# Patient Record
Sex: Female | Born: 1950 | Race: White | Hispanic: No | Marital: Married | Smoking: Former smoker
Health system: Southern US, Community
[De-identification: ages and names within clinical notes are randomized; demographics above are authoritative.]

## PROBLEM LIST (undated history)

## (undated) DIAGNOSIS — C801 Malignant (primary) neoplasm, unspecified: Secondary | ICD-10-CM

## (undated) DIAGNOSIS — M1990 Unspecified osteoarthritis, unspecified site: Secondary | ICD-10-CM

## (undated) DIAGNOSIS — K759 Inflammatory liver disease, unspecified: Secondary | ICD-10-CM

## (undated) DIAGNOSIS — E119 Type 2 diabetes mellitus without complications: Secondary | ICD-10-CM

## (undated) DIAGNOSIS — I10 Essential (primary) hypertension: Secondary | ICD-10-CM

## (undated) DIAGNOSIS — M25519 Pain in unspecified shoulder: Secondary | ICD-10-CM

## (undated) DIAGNOSIS — F419 Anxiety disorder, unspecified: Secondary | ICD-10-CM

## (undated) DIAGNOSIS — G8929 Other chronic pain: Secondary | ICD-10-CM

## (undated) DIAGNOSIS — F329 Major depressive disorder, single episode, unspecified: Secondary | ICD-10-CM

## (undated) DIAGNOSIS — K7460 Unspecified cirrhosis of liver: Secondary | ICD-10-CM

## (undated) DIAGNOSIS — F32A Depression, unspecified: Secondary | ICD-10-CM

## (undated) HISTORY — DX: Inflammatory liver disease, unspecified: K75.9

## (undated) HISTORY — DX: Depression, unspecified: F32.A

## (undated) HISTORY — DX: Other chronic pain: G89.29

## (undated) HISTORY — DX: Unspecified cirrhosis of liver: K74.60

## (undated) HISTORY — DX: Pain in unspecified shoulder: M25.519

## (undated) HISTORY — DX: Major depressive disorder, single episode, unspecified: F32.9

## (undated) HISTORY — DX: Anxiety disorder, unspecified: F41.9

## (undated) HISTORY — PX: CHOLECYSTECTOMY: SHX55

## (undated) HISTORY — PX: APPENDECTOMY: SHX54

## (undated) HISTORY — PX: EYE SURGERY: SHX253

## (undated) HISTORY — PX: OTHER SURGICAL HISTORY: SHX169

---

## 2005-08-02 LAB — HM COLONOSCOPY

## 2005-08-15 ENCOUNTER — Encounter
Admission: RE | Admit: 2005-08-15 | Discharge: 2005-11-13 | Payer: Self-pay | Admitting: Physical Medicine & Rehabilitation

## 2005-08-15 ENCOUNTER — Ambulatory Visit: Payer: Self-pay | Admitting: Physical Medicine & Rehabilitation

## 2005-09-09 ENCOUNTER — Ambulatory Visit: Payer: Self-pay | Admitting: Physical Medicine & Rehabilitation

## 2005-11-11 ENCOUNTER — Ambulatory Visit: Payer: Self-pay | Admitting: Physical Medicine & Rehabilitation

## 2005-11-11 ENCOUNTER — Encounter
Admission: RE | Admit: 2005-11-11 | Discharge: 2006-02-09 | Payer: Self-pay | Admitting: Physical Medicine & Rehabilitation

## 2006-03-08 ENCOUNTER — Encounter
Admission: RE | Admit: 2006-03-08 | Discharge: 2006-06-06 | Payer: Self-pay | Admitting: Physical Medicine & Rehabilitation

## 2006-03-08 ENCOUNTER — Ambulatory Visit: Payer: Self-pay | Admitting: Physical Medicine & Rehabilitation

## 2006-03-28 ENCOUNTER — Ambulatory Visit (HOSPITAL_COMMUNITY): Admission: RE | Admit: 2006-03-28 | Discharge: 2006-03-28 | Payer: Self-pay | Admitting: Family Medicine

## 2006-06-08 ENCOUNTER — Encounter
Admission: RE | Admit: 2006-06-08 | Discharge: 2006-09-06 | Payer: Self-pay | Admitting: Physical Medicine & Rehabilitation

## 2006-06-08 ENCOUNTER — Ambulatory Visit: Payer: Self-pay | Admitting: Physical Medicine & Rehabilitation

## 2006-08-30 ENCOUNTER — Encounter
Admission: RE | Admit: 2006-08-30 | Discharge: 2006-11-28 | Payer: Self-pay | Admitting: Physical Medicine & Rehabilitation

## 2006-08-30 ENCOUNTER — Ambulatory Visit: Payer: Self-pay | Admitting: Physical Medicine & Rehabilitation

## 2006-12-06 ENCOUNTER — Ambulatory Visit: Payer: Self-pay | Admitting: Physical Medicine & Rehabilitation

## 2006-12-06 ENCOUNTER — Encounter
Admission: RE | Admit: 2006-12-06 | Discharge: 2007-03-06 | Payer: Self-pay | Admitting: Physical Medicine & Rehabilitation

## 2007-01-14 ENCOUNTER — Observation Stay (HOSPITAL_COMMUNITY): Admission: EM | Admit: 2007-01-14 | Discharge: 2007-01-16 | Payer: Self-pay | Admitting: Emergency Medicine

## 2007-05-26 ENCOUNTER — Ambulatory Visit: Payer: Self-pay | Admitting: Physical Medicine & Rehabilitation

## 2007-05-26 ENCOUNTER — Encounter
Admission: RE | Admit: 2007-05-26 | Discharge: 2007-05-29 | Payer: Self-pay | Admitting: Physical Medicine & Rehabilitation

## 2007-09-22 ENCOUNTER — Ambulatory Visit: Payer: Self-pay | Admitting: Physical Medicine & Rehabilitation

## 2007-09-22 ENCOUNTER — Encounter
Admission: RE | Admit: 2007-09-22 | Discharge: 2007-09-25 | Payer: Self-pay | Admitting: Physical Medicine & Rehabilitation

## 2007-09-27 ENCOUNTER — Ambulatory Visit: Payer: Self-pay | Admitting: Vascular Surgery

## 2007-10-12 ENCOUNTER — Ambulatory Visit: Payer: Self-pay | Admitting: Vascular Surgery

## 2007-11-29 ENCOUNTER — Ambulatory Visit (HOSPITAL_COMMUNITY): Admission: RE | Admit: 2007-11-29 | Discharge: 2007-11-29 | Payer: Self-pay | Admitting: Family Medicine

## 2008-02-08 ENCOUNTER — Encounter: Admission: RE | Admit: 2008-02-08 | Discharge: 2008-02-08 | Payer: Self-pay | Admitting: Family Medicine

## 2008-03-01 ENCOUNTER — Encounter: Admission: RE | Admit: 2008-03-01 | Discharge: 2008-03-01 | Payer: Self-pay | Admitting: Family Medicine

## 2008-03-21 ENCOUNTER — Encounter
Admission: RE | Admit: 2008-03-21 | Discharge: 2008-05-31 | Payer: Self-pay | Admitting: Physical Medicine & Rehabilitation

## 2008-03-22 ENCOUNTER — Ambulatory Visit: Payer: Self-pay | Admitting: Physical Medicine & Rehabilitation

## 2008-05-31 ENCOUNTER — Ambulatory Visit: Payer: Self-pay | Admitting: Physical Medicine & Rehabilitation

## 2008-08-29 ENCOUNTER — Encounter: Admission: RE | Admit: 2008-08-29 | Discharge: 2008-08-29 | Payer: Self-pay | Admitting: Family Medicine

## 2008-10-03 ENCOUNTER — Encounter
Admission: RE | Admit: 2008-10-03 | Discharge: 2008-10-25 | Payer: Self-pay | Admitting: Physical Medicine and Rehabilitation

## 2008-10-04 ENCOUNTER — Ambulatory Visit: Payer: Self-pay | Admitting: Physical Medicine and Rehabilitation

## 2008-10-09 ENCOUNTER — Emergency Department (HOSPITAL_COMMUNITY): Admission: EM | Admit: 2008-10-09 | Discharge: 2008-10-09 | Payer: Self-pay | Admitting: Emergency Medicine

## 2008-10-14 ENCOUNTER — Encounter
Admission: RE | Admit: 2008-10-14 | Discharge: 2008-10-14 | Payer: Self-pay | Admitting: Physical Medicine and Rehabilitation

## 2008-10-17 ENCOUNTER — Ambulatory Visit: Payer: Self-pay | Admitting: Physical Medicine and Rehabilitation

## 2008-11-08 ENCOUNTER — Ambulatory Visit: Payer: Self-pay | Admitting: Oncology

## 2008-11-15 LAB — CBC WITH DIFFERENTIAL/PLATELET
BASO%: 0.1 % (ref 0.0–2.0)
Basophils Absolute: 0 10*3/uL (ref 0.0–0.1)
EOS%: 2 % (ref 0.0–7.0)
HCT: 41.1 % (ref 34.8–46.6)
HGB: 14.3 g/dL (ref 11.6–15.9)
LYMPH%: 27.5 % (ref 14.0–49.7)
MCH: 33.2 pg (ref 25.1–34.0)
MCHC: 34.7 g/dL (ref 31.5–36.0)
NEUT%: 64.3 % (ref 38.4–76.8)
Platelets: 78 10*3/uL — ABNORMAL LOW (ref 145–400)
lymph#: 1.1 10*3/uL (ref 0.9–3.3)

## 2008-11-18 LAB — COMPREHENSIVE METABOLIC PANEL
Albumin: 3.4 g/dL — ABNORMAL LOW (ref 3.5–5.2)
Alkaline Phosphatase: 65 U/L (ref 39–117)
BUN: 9 mg/dL (ref 6–23)
Calcium: 8.5 mg/dL (ref 8.4–10.5)
Chloride: 106 mEq/L (ref 96–112)
Creatinine, Ser: 0.54 mg/dL (ref 0.40–1.20)
Glucose, Bld: 141 mg/dL — ABNORMAL HIGH (ref 70–99)
Potassium: 3.8 mEq/L (ref 3.5–5.3)

## 2008-11-18 LAB — ANTI-NUCLEAR AB-TITER (ANA TITER): ANA Titer 1: NEGATIVE

## 2008-11-18 LAB — ANA: Anti Nuclear Antibody(ANA): POSITIVE — AB

## 2008-11-18 LAB — RHEUMATOID FACTOR: Rhuematoid fact SerPl-aCnc: <20 IU/mL (ref 0–20)

## 2008-11-28 ENCOUNTER — Ambulatory Visit (HOSPITAL_COMMUNITY): Admission: RE | Admit: 2008-11-28 | Discharge: 2008-11-28 | Payer: Self-pay | Admitting: Oncology

## 2009-02-28 ENCOUNTER — Encounter: Admission: RE | Admit: 2009-02-28 | Discharge: 2009-02-28 | Payer: Self-pay | Admitting: Family Medicine

## 2009-03-04 ENCOUNTER — Ambulatory Visit: Payer: Self-pay | Admitting: Oncology

## 2009-04-08 ENCOUNTER — Ambulatory Visit: Payer: Self-pay | Admitting: Oncology

## 2009-05-02 LAB — CBC WITH DIFFERENTIAL/PLATELET
BASO%: 0.6 % (ref 0.0–2.0)
Basophils Absolute: 0 10*3/uL (ref 0.0–0.1)
Eosinophils Absolute: 0.1 10*3/uL (ref 0.0–0.5)
HCT: 38.2 % (ref 34.8–46.6)
HGB: 13.3 g/dL (ref 11.6–15.9)
LYMPH%: 39 % (ref 14.0–49.7)
MONO#: 0.3 10*3/uL (ref 0.1–0.9)
NEUT#: 1.7 10*3/uL (ref 1.5–6.5)
NEUT%: 48.2 % (ref 38.4–76.8)
Platelets: 66 10*3/uL — ABNORMAL LOW (ref 145–400)
WBC: 3.5 10*3/uL — ABNORMAL LOW (ref 3.9–10.3)
lymph#: 1.4 10*3/uL (ref 0.9–3.3)

## 2009-10-06 ENCOUNTER — Ambulatory Visit: Payer: Self-pay | Admitting: Oncology

## 2010-03-12 ENCOUNTER — Ambulatory Visit: Admission: RE | Admit: 2010-03-12 | Discharge: 2010-03-12 | Payer: Self-pay | Admitting: Gynecologic Oncology

## 2010-05-18 ENCOUNTER — Encounter (INDEPENDENT_AMBULATORY_CARE_PROVIDER_SITE_OTHER): Payer: Self-pay | Admitting: Obstetrics & Gynecology

## 2010-05-18 ENCOUNTER — Ambulatory Visit (HOSPITAL_COMMUNITY): Admission: RE | Admit: 2010-05-18 | Discharge: 2010-05-19 | Payer: Self-pay | Admitting: Obstetrics & Gynecology

## 2010-05-18 HISTORY — PX: ABDOMINAL HYSTERECTOMY: SHX81

## 2010-08-23 ENCOUNTER — Encounter: Payer: Self-pay | Admitting: Family Medicine

## 2010-08-24 ENCOUNTER — Encounter: Payer: Self-pay | Admitting: Family Medicine

## 2010-10-14 LAB — BASIC METABOLIC PANEL
CO2: 25 mEq/L (ref 19–32)
Calcium: 8.3 mg/dL — ABNORMAL LOW (ref 8.4–10.5)
Chloride: 105 mEq/L (ref 96–112)
GFR calc non Af Amer: >60 mL/min (ref >60)
Glucose, Bld: 135 mg/dL — ABNORMAL HIGH (ref 70–99)
Sodium: 136 mEq/L (ref 135–145)

## 2010-10-14 LAB — CBC
HCT: 38.3 % (ref 36.0–46.0)
Hemoglobin: 13.1 g/dL (ref 12.0–15.0)
MCHC: 34.3 g/dL (ref 30.0–36.0)
Platelets: 54 10*3/uL — ABNORMAL LOW (ref 150–400)
RBC: 4 MIL/uL (ref 3.87–5.11)
RDW: 13.9 % (ref 11.5–15.5)
WBC: 5.7 10*3/uL (ref 4.0–10.5)

## 2010-10-14 LAB — COMPREHENSIVE METABOLIC PANEL
ALT: 67 U/L — ABNORMAL HIGH (ref 0–35)
AST: 53 U/L — ABNORMAL HIGH (ref 0–37)
Albumin: 3.2 g/dL — ABNORMAL LOW (ref 3.5–5.2)
Alkaline Phosphatase: 46 U/L (ref 39–117)
Potassium: 3.5 mEq/L (ref 3.5–5.1)
Sodium: 136 mEq/L (ref 135–145)
Total Protein: 5.6 g/dL — ABNORMAL LOW (ref 6.0–8.3)

## 2010-10-14 LAB — ABO/RH: ABO/RH(D): A POS

## 2010-10-15 LAB — CBC
HCT: 40.2 % (ref 36.0–46.0)
Hemoglobin: 13.8 g/dL (ref 12.0–15.0)
MCH: 33 pg (ref 26.0–34.0)
MCHC: 34.3 g/dL (ref 30.0–36.0)
MCV: 96.2 fL (ref 78.0–100.0)
Platelets: 62 10*3/uL — ABNORMAL LOW (ref 150–400)
RBC: 4.18 MIL/uL (ref 3.87–5.11)
RDW: 13.8 % (ref 11.5–15.5)
WBC: 3.5 10*3/uL — ABNORMAL LOW (ref 4.0–10.5)

## 2010-10-15 LAB — SURGICAL PCR SCREEN
MRSA, PCR: NEGATIVE
Staphylococcus aureus: NEGATIVE

## 2010-10-15 LAB — COMPREHENSIVE METABOLIC PANEL
ALT: 82 U/L — ABNORMAL HIGH (ref 0–35)
AST: 60 U/L — ABNORMAL HIGH (ref 0–37)
Albumin: 3.7 g/dL (ref 3.5–5.2)
Alkaline Phosphatase: 58 U/L (ref 39–117)
BUN: 13 mg/dL (ref 6–23)
CO2: 30 mEq/L (ref 19–32)
Calcium: 9.2 mg/dL (ref 8.4–10.5)
Chloride: 104 mEq/L (ref 96–112)
Creatinine, Ser: 0.59 mg/dL (ref 0.4–1.2)
GFR calc Af Amer: >60 mL/min (ref >60)
GFR calc non Af Amer: >60 mL/min (ref >60)
Glucose, Bld: 105 mg/dL — ABNORMAL HIGH (ref 70–99)
Potassium: 4.2 mEq/L (ref 3.5–5.1)
Sodium: 139 mEq/L (ref 135–145)
Total Bilirubin: 1.1 mg/dL (ref 0.3–1.2)
Total Protein: 6.5 g/dL (ref 6.0–8.3)

## 2010-10-15 LAB — PROTIME-INR
INR: 1.1 (ref 0.00–1.49)
Prothrombin Time: 14.4 seconds (ref 11.6–15.2)

## 2010-10-26 ENCOUNTER — Other Ambulatory Visit: Payer: Self-pay | Admitting: Obstetrics & Gynecology

## 2010-10-26 DIAGNOSIS — Z1231 Encounter for screening mammogram for malignant neoplasm of breast: Secondary | ICD-10-CM

## 2010-10-29 ENCOUNTER — Ambulatory Visit: Payer: Self-pay

## 2010-11-12 LAB — BASIC METABOLIC PANEL
BUN: 5 mg/dL — ABNORMAL LOW (ref 6–23)
CO2: 27 mEq/L (ref 19–32)
Calcium: 9.4 mg/dL (ref 8.4–10.5)
GFR calc non Af Amer: >60 mL/min (ref >60)
Glucose, Bld: 94 mg/dL (ref 70–99)
Potassium: 3.8 mEq/L (ref 3.5–5.1)

## 2010-11-12 LAB — RAPID URINE DRUG SCREEN, HOSP PERFORMED
Barbiturates: NOT DETECTED
Benzodiazepines: POSITIVE — AB

## 2010-11-12 LAB — DIFFERENTIAL
Basophils Absolute: 0 10*3/uL (ref 0.0–0.1)
Basophils Relative: 0 % (ref 0–1)
Eosinophils Absolute: 0 10*3/uL (ref 0.0–0.7)
Eosinophils Relative: 1 % (ref 0–5)
Lymphocytes Relative: 19 % (ref 12–46)
Monocytes Absolute: 0.4 10*3/uL (ref 0.1–1.0)

## 2010-11-12 LAB — CBC
HCT: 47 % — ABNORMAL HIGH (ref 36.0–46.0)
MCHC: 34 g/dL (ref 30.0–36.0)
Platelets: 90 10*3/uL — ABNORMAL LOW (ref 150–400)
RDW: 14.1 % (ref 11.5–15.5)

## 2010-11-12 LAB — TRICYCLICS SCREEN, URINE: TCA Scrn: NOT DETECTED

## 2010-11-18 ENCOUNTER — Encounter: Payer: Self-pay | Admitting: Physician Assistant

## 2010-12-15 NOTE — Assessment & Plan Note (Signed)
Ms. Vandall returns to the clinic today for followup evaluation.  She did  undergo right shoulder surgery with Dr. Vernell Leep at [facility] on  May 01, 2008.  She is not sure exactly what was done, but she was  told that they had kept the biceps muscle and treated her bone spurs and  then grafted an area of mesh over her rotator cuff, which was  essentially nonexistent.  She is due to follow up on June 13, 2008  with Dr. Vernell Leep and is due to start therapy soon after that time.  Dr.  Vernell Leep is reporting that he would not plan on doing the left shoulder  for another 3 months.   In terms of her pain relief, she was using a slightly increased amount  of pain medicines after her surgery on May 01, 2008.  She has  resorted to her usual pain regimen, which includes the OxyContin  sustained release 80 mg in the morning and 20 mg in the evening along  with the oxycodone immediate release of approximately 16 per day.  She  does need a refill on her oxycodone approximately mid November, but she  just had a refill on her sustained release OxyContin 80 mg and 20 mg.   REVIEW OF SYSTEMS:  Noncontributory.   MEDICATIONS:  1. Lexapro 20 mg daily.  2. Oxycodone sustained release 80 mg every morning and 20 mg nightly.  3. Oxycodone immediate release 5 mg 1-4 tablets p.o. q.i.d. p.r.n.      (approximately 16 per day).   PHYSICAL EXAMINATION:  GENERAL:  Reasonably well-appearing, middle-aged  adult female in mild acute discomfort with a sling on her right upper  extremity.  VITAL SIGNS:  Blood pressure is 118/70 with a pulse of 74, respiratory  rate 18, and O2 saturation 95% on room air.  EXTREMITY:  Left upper extremity strength was 4-/5 and left lower  extremity strength was 5/5.  We did not test her right upper extremity.   IMPRESSION:  1. Chronic bilateral shoulder pain with bilateral rotator cuff repairs      and avascular necrosis of the right shoulder.  2. Status post recent  right rotator cuff surgery with Dr. Vernell Leep at      [facility] on May 01, 2008.   In the office today, we did refill the patient's oxycodone with no  refill on the OxyContin necessary since she just had a refill a day or  so ago.  We will plan on seeing her in followup in approximately 5  month's time.  She has done extremely well overall and is weaned from  the higher pain medicines that she was using postoperatively.  Hopefully, we can continue to wean the pain medicines from her, but that  would not likely be possible until she has the definitive left shoulder  surgery at [facility] in 3-4 month's time.  We will plan on seeing her in  followup as noted above.           ______________________________  Ellwood Dense, M.D.     DC/MedQ  D:  05/31/2008 11:26:14  T:  05/31/2008 23:53:02  Job #:  [id number]

## 2010-12-15 NOTE — Assessment & Plan Note (Signed)
Melissa [name] is a 60 year old married woman, who has been a previous  patient of Dr. Ellwood Dense.  She has requested a visit today to  discuss her treatment options.   She is currently being discharged for taking and using morphine from  another patient, while she was on narcotic pain medications through this  clinic.   She was referred to the emergency room and subsequently decided to seek  treatment at [facility], and has been currently  involved in a Suboxone treatment program at [facility]   Long discussion regarding treatment options for her neck and shoulder  pain.  MRI of her cervical spine was reviewed with her today, it was  done on October 14, 2008.  She was also given a copy of the report.   At this point, she is electing to follow up with her primary care  physician possibly or seek counseled from Dr. Vernell Leep, her orthopedist,  at [facility] in [place].  She has not decided which decision  she will go with at this time.  She is thinking, she would like to  finish up the Suboxone program prior to engaging in further workup for  any further neck problems.   I have answered all her questions today.  I spent 35 minutes talking to  her regarding the above issues.  She was given a copy of her [employer]  report as well as her cervical MRI dated October 14, 2008.            ______________________________  Brantley Stage, M.D.     DMK/MedQ  D:  10/17/2008 09:53:49  T:  10/17/2008 21:21:31  Job #:  [id number]

## 2010-12-15 NOTE — Discharge Summary (Signed)
Melissa [name], Melissa [name]                ACCOUNT NO.:  [id number]   MEDICAL RECORD NO.:  [id number]          PATIENT TYPE:  INP   LOCATION:  5730                         FACILITY:  [facility]   PHYSICIAN:  Hind I Elsaid, MD      DATE OF BIRTH:  [birth year]   DATE OF ADMISSION:  01/13/2007  DATE OF DISCHARGE:  01/19/2007                               DISCHARGE SUMMARY   PRIMARY CARE PHYSICIAN:  Dr. Ace Gins with [facility].   DISCHARGE DIAGNOSES:  1. Intractable nausea and vomiting, which resolved most probably      secondary to acute gastroenteritis.  2. Bilateral rotator cuff surgery in 2001.  3. Hepatitis C.  4. Depression.  5. Status post cholecystectomy and appendectomy.  6. Hypokalemia, status post replacement.   DISCHARGE MEDICATIONS:  Zofran 4 mg p.o. q.8 hours for p.r.n.   CONSULTATIONS:  None.   PROCEDURE:  Abdominal x-ray on June 14, no obstructive or free air.  No  acute cardiopulmonary disease.   BRIEF HISTORY:  1. You can review the history presented by Dr. Lonia Blood.  A 60-year-      old female with history of rotator cuff surgery, chronic pain      presented with worsening diarrhea, nausea and vomiting that was not      responsive to conservative treatment.  Admission diagnosis was      intractable nausea and vomiting.  Patient, since hospitalization,      has never had result of diarrhea.  She had a regular bowel movement      yesterday, so main admission is intractable nausea and vomiting,      which responds to supportive medications with IV fluid and      antiemetic medications.  Patient kept n.p.o. during      hospitalization, advance of the diet was done.  On the date of      discharge, patient able to eat regular diet with no further episode      of vomiting, but still had mild grade nausea, for which she will      get Zofran p.r.n. doses.  2. Hypokalemia, status post replacement.  3. Mildly elevated myoglobin to 1.5, most probably due to  vomiting.      Also, the patient has a history of hepatitis C and this can be      followed up as an outpatient.   On the date of discharge, patient's vital signs was a temperature of  98.2, pulse rate 64, blood pressure 116/67, respiratory rate 18, oxygen  saturation 99% on room air.  We felt that the patient is medically  stable to be discharged home and follow up with her primary care  physician and patient asked to present to the hospital if she has any  nausea, any severe vomiting or abdominal pain.      [name], MD  Electronically Signed     HIE/MEDQ  D:  01/16/2007  T:  01/16/2007  Job:  [id number]

## 2010-12-15 NOTE — Consult Note (Signed)
NEW PATIENT CONSULTATION   Melissa [name], Melissa [name]  DOB:  1950/12/15                                       09/27/2007  ZOXWR#:[id number]   HISTORY:  The patient presents today for evaluation of venous pathology.  She is a healthy 60 year old white female who reports progressive  discomfort in both lower extremities.  She reports that this occurs  after prolonged standing towards the end of the day.  She does report  some swelling in her calves.  She does not have any history of deep  venous thrombosis or bleeding.  She does elevate her legs when possible.  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  She does not have any history of deep venous thrombosis.   PAST MEDICAL HISTORY:  Without any major medical difficulties.  She does  have a history of reflux but no other major medical difficulties.   FAMILY HISTORY:  Negative for premature atherosclerotic disease.   SOCIAL HISTORY:  She is married.  She smokes but is quitting with  Chantix.  She does not drink alcohol on a regular basis.  Her weight is  140 pounds, height 5 feet 7 inches tall.   ALLERGIES:  She has no known drug allergies.   MEDICATIONS:  Chantix, Nexium, Lexapro, oxycodone p.r.n. and Efudex  [name].i.d. each Wednesday.   PHYSICAL EXAMINATION:  General:  She is a well-developed, well-nourished  white female appearing stated age of 72.  Vital signs:  Blood pressure  is 122/76, pulse 83, respirations 18.  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  She  does not have any venous varicosities or reticular veins.   She underwent venous duplex evaluation to rule out venous hypertension  as to the etiology of her discomfort.  This showed only mild disease  with mild reflux in her right common femoral vein and a perforator  incompetence in the left femoral vein  area.  She does not have any  evidence of greater small saphenous reflux.  Discussed this at length  with the patient.  I do not see any role for any other treatment for her  venous pathology.  I do not feel this is causing her discomfort.  She is  concerned regarding the appearance of her telangiectasia and I have  explained that this could potentially be treated with vascular therapy  for appearance reasons.  She is interested in this and will discuss this  further with Melissa Hoof, RN who is currently performing sclerotherapy in  our office.  She will see me again on a p.r.n. basis.   Melissa [name], M.D.  Electronically Signed   TFE/MEDQ  D:  09/27/2007  T:  09/29/2007  Job:  [id number]

## 2010-12-15 NOTE — Assessment & Plan Note (Signed)
Melissa [name] returns to the clinic today for evaluation.  The patient has  started seeing Dr. Vernell Leep at [facility].  He has  apparently reviewed MRI scans of her bilateral shoulders and has told  her that he would like to try to help her with shoulder surgery.  There  is an arthroscopic surgery that he is planning hopefully in early  October 2009.  He is not sure if he will have to patch her rotator cuff,  but he is still considering that.  She did have a recent MRI scan of her  left shoulder, which showed significant pathology including suspected  full-thickness tear of the supraspinatus and infraspinatus tendon.  She  also had high-grade partial tearing of the biceps tendon.  There was  evidence of prior bursa activity.  She had the suspected tear of the  anterior-inferior glenoid labrum with a frayed superior labrum.  Dr.  Vernell Leep apparently is interested in trying to help her right shoulder at  the present time with possible surgery on the left in the future.  He is  not planning a total shoulder replacement, but planning arthroscopy and  possible patching of the rotator cuff on the right side.   In the meantime, the patient continues to use the OxyContin sustained  release, 80 mg in the morning and 20 mg in the evening.  She also takes  oxycodone immediate release 5 mg, approximately 12 per day.   REVIEW OF SYSTEMS:  Noncontributory.   MEDICATIONS:  1. Lexapro 20 mg daily.  2. Oxycodone sustained release, 80 mg q.a.m. and 20 mg nightly.  3. Oxycodone immediate release 5 mg 1-4 tablets p.o. q.i.d. p.r.n.      (approximately 12 per day).   PHYSICAL EXAMINATION:  GENERAL:  Reasonably well-appearing middle-aged  adult female in moderate acute discomfort.  VITAL SIGNS:  Blood pressure 123/69 with a pulse of 68, respiratory rate  18, and O2 saturation 97% on room air.  EXTREMITIES:  She has 4-/5 strength throughout the bilateral upper  extremities proximally and distally  4+/5 strength.  Lower extremity  strength was 5-/5.  She has decreased range of motion of the bilateral  shoulders with pain.   IMPRESSION:  Chronic bilateral shoulder pain with bilateral rotator cuff  repairs and avascular necrosis of the right shoulder identified.   In the office today, we did refill the patient's oxycodone immediate  release at 10 mg instead of 5 mg.  She is allowed to use 1-2 p.o. q.i.d.  p.r.n., then this will decrease the number of tablets that she needs to  take.  This prescription is written for April 18, 2008, as she has  had a recent refill.  She has also had a recent refill on her continuous  release OxyContin medication.  We will plan on seeing the patient in  followup in late October to early November 2009, hopefully after a  successful shoulder surgery on the right with  Dr. Vernell Leep.           ______________________________  Ellwood Dense, M.D.     DC/MedQ  D:  03/22/2008 11:43:06  T:  03/23/2008 00:15:00  Job #:  [id number]

## 2010-12-15 NOTE — H&P (Signed)
NAMEALEINA, BURGIO NO.:  [id number]   MEDICAL RECORD NO.:  [id number]          PATIENT TYPE:  OBV   LOCATION:  5730                         FACILITY:  [facility]   PHYSICIAN:  Lonia Blood, M.D.       DATE OF BIRTH:  Oct 17, 1950   DATE OF ADMISSION:  01/13/2007  DATE OF DISCHARGE:                              HISTORY & PHYSICAL   PRIMARY CARE PHYSICIAN:  Ace Gins, M.D. with [facility].   CHIEF COMPLAINT:  Nausea.   HISTORY OF PRESENT ILLNESS:  Melissa [name] is a 60 year old woman with a  history of rotator cuff surgeries, chronic pain who presents to [facility] emergency room after about a week of worsening diarrhea, nausea and  vomiting that was not responsive to the conservative treatment.  The  patient reports initially she had significant diarrhea, but then there  was a lot of nausea and inability to read.  The patient reports about 2  weeks ago, she took herself off opiates but she did it gradually, so she  would not develop withdrawal.  She will not detail the exact way she did  it.   PAST MEDICAL HISTORY:  1. Bilateral rotator cuff surgeries in the 2001.  2. Hepatitis C.  3. Depression.  4. Cholecystectomy in 1972.  5. Appendectomy.   FAMILY HISTORY:  The patient's mother died with breast cancer.  The  patient's father is alive and healthy.  The patient's brother died with  esophageal cancer and the patient has three sisters that are fine.   SOCIAL HISTORY:  The patient is married, smokes half a pack of  cigarettes a day, does not drink alcohol.  She has a son and she works  as a Patent attorney.   The patient's medications used to be:  1. Lexapro.  2. OxyContin 40 mg twice a day.  3. Oxycodone.  4. Oral contraceptives.  But, the patient has stopped taking the medication about two weeks ago.   REVIEW OF SYSTEMS:  As per HPI.  All other systems are negative.   PHYSICAL EXAMINATION:  VITAL SIGNS:  Upon admission  temperature 98.1,  pulse 57, respirations 20, blood pressure 148/87.  GENERAL APPEARANCE:  A well-developed, well-nourished woman in no acute  distress lying on the stretcher, alert, oriented to place, person, and  time.  HEAD:  Normocephalic, atraumatic.  EYES:  Pupils equal and round, reactive to light.  Extraocular movements  intact.  THROAT:  Clear.  NECK:  Supple.  No JVD.  CHEST:  Clear to auscultation bilaterally without wheezes, rhonchi or  crackles.  HEART:  There is regular rhythm without murmurs, rubs or gallop.  ABDOMEN:  Soft, nontender.  Bowel sounds are present.   LABORATORY VALUES:  At the time of admission, sodium was 138, potassium  3.5, chloride 104, BUN 10, glucose 91, albumin 3.7, protein 6.5, AST 49.  Hemoglobin 16, hematocrit 49.  Alkaline phosphatase 41, bilirubin 2,  lipase 24.  Urinalysis:  Within normal limits.  Abdominal x-ray:  No  obstruction or free air.   ASSESSMENT/PLAN:  1. A  60 year old woman with prior cholecystectomy, appendectomy      presents with nausea, vomiting, and diarrhea, probable acute      gastroenteritis versus opiate withdrawal.  The patient will be      admitted, placed on intravenous fluids, antiemetics will be      provided.  If the patient continues to have diarrhea then stool      studies will be sent.  2. Tobacco abuse.  The patient will be counseled and provided with a      nicotine patch as needed.  3. Hepatitis C.  This seems to be minimally active as indicated by a      slight elevation in AST, ALT, and total bilirubin.  The patient      reports that she had previous treatment and outpatient followup can      be done.      Lonia Blood, M.D.  Electronically Signed     SL/MEDQ  D:  01/14/2007  T:  01/14/2007  Job:  [id number]   cc:   Ace Gins, MD

## 2010-12-15 NOTE — Assessment & Plan Note (Signed)
Melissa [name] returns to the clinic today for follow up evaluation.  She  has been using the OxyContin sustained release 40 mg twice a day since  January.  She feels that she has developed some intolerance and wonders  about an adjustment.  She has been using 12 of the immediate release per  day to get adequate pain relief.  She does ask for refills of her  lidocaine patch.   The other concern that she has is about her father with metastatic lung  cancer.  He apparently has pain medicines made available to him, but she  wonders if necessary if he could be seen in the office of pain  management.  I have told her that if that is necessary we can certainly  see her dad and treat him as appropriate.   REVIEW OF SYSTEMS:  Noncontributory.   MEDICATIONS:  1. Lexapro 20 mg daily.  2. Oxycodone 5 mg 1-4 tablets p.o. q.i.d. p.r.n. (approximately 12 per      day).  3. Oxycodone sustained release 40 mg b.i.d.   PHYSICAL EXAMINATION:  GENERAL:  Well-appearing middle-aged adult female  in mild to moderate acute discomfort.  VITAL SIGNS:  Blood pressure was 138/82 with pulse of 88, respiratory  rate 18 and O2 saturation 100% on room air.  The patient has 4/5  strength in the bilateral upper extremities and 5-/5 strength in the  bilateral lower extremities.   IMPRESSION:  Chronic right shoulder pain with a history of bilateral  rotator cuff repairs and avascular necrosis of the right shoulder.   In the office today, we did refill the patient's oxycodone immediate  release.  We also changed her sustained release to 80 mg q.a.m. and 20  mg q.h.s.  This will be a total increase from 80 mg a day to 100 mg  daily.  This should give her better pain relief over all.  She still has  the immediate release medication as necessary.  Will continue to make  adjustments as we need to and when we see her in follow up in  approximately 4 months time.           ______________________________  Ellwood Dense,  M.D.     DC/MedQ  D:  05/29/2007 12:13:16  T:  05/29/2007 15:25:46  Job #:  [id number]
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
## 2010-12-15 NOTE — Procedures (Signed)
LOWER EXTREMITY VENOUS REFLUX EXAM   INDICATION:  Bilateral lower extremity varicose veins.   EXAM:  Using color-flow imaging and pulse Doppler spectral analysis, the  right and left common femoral, superficial femoral, popliteal, posterior  tibial, greater and lesser saphenous veins are evaluated.  There is  evidence suggesting deep venous insufficiency in the right common  femoral vein.   The right and left saphenofemoral junctions are competent.  The right  and left GSV are competent.   The right and left proximal short saphenous veins demonstrate  competency.   GSV Diameter (used if found to be incompetent only)                                            Right    Left  Proximal Greater Saphenous Vein           cm       cm  Proximal-to-mid-thigh                     cm       cm  Mid thigh                                 cm       cm  Mid-distal thigh                          cm       cm  Distal thigh                              cm       cm  Knee                                      cm       cm   IMPRESSION:  1. No greater saphenous vein reflux is identified bilaterally.  2. The right and left greater saphenous veins are not aneurysmal.  3. The right and left greater saphenous veins are not tortuous.  4. The right common femoral vein is incompetent.  The remaining deep      veins are competent bilaterally.  There is an incompetent      perforator of the left superficial femoral vein at the level of the      mid thigh.  5. The right and left lesser saphenous veins are competent.  6. Mild reflux is seen in the greater saphenous veins bilaterally with      the patient standing.       ___________________________________________  Larina Earthly, M.D.   MC/MEDQ  D:  09/27/2007  T:  09/28/2007  Job:  [id number]

## 2010-12-15 NOTE — Assessment & Plan Note (Signed)
Melissa [name] returns to clinic today for follow-up evaluation.  We last  saw her in the office 05/29/07.  At that time we had increased her  OxyContin sustained release from a total of 80 mg a day to 100 mg a day  using an 80 mg tablet along with 20 mg tablet.  She does report some  increased pain relief although that may be counteractive by some  tolerance that she is building up to the medication.  She does have  sufficient supply of her sustained release and her immediate release  office in the medicine today.  She has had the addition of Nexium added  to her medication regimen.   The patient has complaints about the facility fee that continues to add  up her in case.  She is seeing other physicians and they are also  charging facility fees.  She has difficulty keeping up with those  payments but I have told her that I really cannot do much about it other  than seeing her irregularly and trying to keep the bills down to a  minimum.   REVIEW OF SYSTEMS:  Non-contributory.   MEDICATIONS:  1. Lexapro 20 mg q. day.  2. Oxycodone 5 mg 1-4 tablets p.o. q.i.d. p.r.n. (approximately 12 per      day).  3. Oxycodone sustained release 80 mg q. a.m. and 20 mg q. h.s.   PHYSICAL EXAMINATION:  GENERAL:  A reasonably well appearing middle aged  adult female in mild to moderate acute discomfort.  VITALS:  Blood pressure is 123/81 with pulse of 86, respiratory rate 18  and O2 sats 98% on room air.  EXTREMITIES:  She has 4+/5 strength throughout the bilateral upper  extremities and 5-/5 strength throughout the bilateral lower  extremities.  She has decreased range of motion at the bilateral  shoulders.   IMPRESSION:  Chronic right shoulder pain with history of bilateral  rotator cuff repairs and avascular necrosis of the right shoulder.   In the office today no refill on medication is necessary. Will plan on  seeing her in follow-up in approximately 6 months time.  Hopefully she  can continue  to afford the medications and the doctor's visits along  with the facility fees as they tend to build up on her.  Will plan on  seeing her in follow-up as noted above.           ______________________________  Ellwood Dense, M.D.     DC/MedQ  D:  09/25/2007 12:17:32  T:  09/25/2007 15:49:15  Job #:  [id number]

## 2010-12-18 NOTE — Assessment & Plan Note (Signed)
DATE OF VISIT:  November 12, 2005   REASON FOR VISIT:  Ms. Quirion returns to clinic today for followup  evaluation.  She is a middle-age, adult female with severe right shoulder  problems which have been evaluated by at least two orthopedists in the past  that told she had avascular necrosis and required a total shoulder  replacement.  We were awaiting her insurance which she has been approved for  as of April 2007.  Unfortunately, this was considered a preexisting  condition and they will not cover her until at least a year.  She has not  spoken with the insurance company and I have encouraged her to do so.  In  the meantime, we have been trying to use high-dose narcotics and she has  been using Percocet 10/325 approximately three tablets four times a day.  This puts her at a maximum of Tylenol a dose per day.  We have decided to  adjust that medication in the office today.   MEDICATIONS:  1.  Lexapro 20 mg daily.  2.  Percocet 10/325 mg three tablets p.o. four times a day p.r.n.   PHYSICAL EXAMINATION:  GENERAL:  A reasonably well-appearing, thin, adult  female in mild to moderate acute discomfort.  VITAL SIGNS:  Blood pressure 144/93 with pulse of 103, respirations 17 and  O2 saturations 100% on room air.  NEUROLOGIC:  She has 4-/5 strength in bilateral upper extremities with her  arms at her side.  She has very severe difficulty raising either arm above  shoulder height.  She has severe pain of her right shoulder when she  attempts any motion other than just down at her side.   IMPRESSION:  Chronic right shoulder pain with history of bilateral rotator  cuff repairs and avascular necrosis with right shoulder.   At the present time, I have asked the patient to contact her insurance  carrier and to plead with them again to cover her at least prior to the  previously stated 1 year period.  She does have a pre-existing condition,  but she is in desperate need of a shoulder replacement.   She has been told  that by at least two orthopedists and were having to use high-doses of  narcotic medications to give her even any reasonable relief.  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  Her options are only limited to  probably Disability and she certainly wants to avoid that if at all  possible.   In the office today, we did give her a new prescription for oxycodone to be  used 5 mg one to two tablets p.o. four times a day, a total of 200 were  prescribed.  This is in place of her Percocet to avoid the excess of Tylenol  use.  We also gave her 10 patches of the Lidoderm 5% to be applied to her  right shoulder, on 12 hours and off 12 hours.  This should help give her  some relief so that we will not have to use the heavy duty narcotics at the  expense that we are at present.   PLAN:  Will plan on seeing her in followup in approximately 4 months' time.  She will be in contact with [place] regarding any information that needs to be  supplied to [facility], which is her present insurance carrier.  ______________________________  Ellwood Dense, M.D.     DC/MedQ  D:  11/12/2005 16:50:02  T:  11/13/2005 09:48:54  Job #:  [id number]

## 2010-12-18 NOTE — Assessment & Plan Note (Signed)
FOLLOW UP OFFICE NOTE   Melissa [name] returns to clinic today for follow up evaluation.  She did  see Dr. Teressa Senter a few weeks ago.  He looked at her shoulder and she  reports that she had a good visit with him and got good information.  He  definitely recommended against shoulder arthroplasty surgery at this  time.  He recommended that she try to maintain as she at present and  avoid the surgery until absolutely necessary.  She is continuing to use  the pain medicines and Dr. Teressa Senter has told her that he sees no reason  that she should not continue on those at the present time.  She does  have the name of a physician recommended by Dr. Teressa Senter who was  previously at [facility], but is now in a private practice.   In terms of her pain medicines she reports that she is not getting as  much relief from the OxyContin sustained release used 20 mg twice a day.  She has increased her immediate release up to approximately 16 tablets  per day to get adequate relief.  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  She has tried Topamax and got no relief.  She also was too  sleepy when she tried Lyrica.  She does burning pain of her shoulder.  She has a TENS unit at home and I have asked her to retry use of the  TENS unit when she has that burning pain that comes on approximately  every other month and lasts for 5-7 days.   MEDICATIONS:  1. Lexapro 20 mg daily.  2. Oxycodone 5 mg 4 tablets q.i.d. p.r.n.(approximately 16 per day).  3. OxyContin sustained release 20 mg b.i.d.   REVIEW OF SYSTEMS:  Non contributory.   PHYSICAL EXAMINATION:  A well appearing fit adult female in mild to no  acute discomfort.  Blood pressure 144/63 with a pulse of 88, respiratory rate 16 and an O2  saturation 100% on room air.  She has 4/5 strength of the bilateral upper extremities.  She has  problems raising her right shoulder above mid chest level.  Lower  extremity showed 5/5 strength throughout.   IMPRESSION:   Chronic right shoulder pain with history of bilateral  rotator cuff repairs and avascular necrosis of the right shoulder.   At the present time we have refilled her OxyContin sustained release at  40 mg 1 tablet q. 12hours.  That is an increase from her 20 mg that she  is using at present.  I have asked her to use up the 20s by taking 2  tablets at a time and then fill the prescription for September 03, 2006.  We have also refilled Oxycodone.  I am hoping that she will be able to  use less of the OxyContin that she is at present IE:  16 tablets per  day.  We have given her a prescription for Oxycodone 5 mg to be used 2-3  tablets q.i.d. p.r.n.  She understands that she is only to use that  medicine as absolutely necessary and hopefully the script will last her  for a long period of time.  She will be using the TENS unit for her  burning pain and will avoid any Lyrica or Neurontin.   We will plan on seeing the patient in follow up in approximately 2  months time.           ______________________________  Ellwood Dense, M.D.  DC/MedQ  D:  08/31/2006 09:10:47  T:  08/31/2006 10:05:58  Job #:  [id number]

## 2010-12-18 NOTE — Assessment & Plan Note (Signed)
[name] returns to clinic today for follow-up evaluation.  Overall, she  reports that she is doing well on her OxyContin 20 mg used twice a day and  her oxycodone used three times per day.  She does need refills on each of  those in the office today.  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  She is still interested in a right shoulder  replacement and reports that she will have insurance as of December 2007.  We have asked her to call back in October so that we can set her up with an  appointment with the upper extremity orthopedic group, specifically Dr.  Stark Jock [facility].   MEDICATIONS:  1. Lexapro 20 mg daily.  2. Oxycodone 5 mg two to three tablets p.o. t.i.d. p.r.n. (approximately 9      per day).  3. OxyContin sustained release 20 mg b.i.d.   REVIEW OF SYSTEMS:  Positive for poor appetite.   PHYSICAL EXAMINATION:  Well-appearing, fit, adult female in mild acute  discomfort.  Blood pressure 133/82 with a pulse of 93, respiratory rate 16  and O2 saturation 99% on room air.  She has 4-/5 strength in the bilateral  upper extremities.  She has significant problems raising her right shoulder  above mid chest level.   IMPRESSION:  Chronic right shoulder pain with history of bilateral rotator  cuff repairs and avascular necrosis of the right shoulder.   In the office today we have asked her to call into the office in October so  that we can set her up with the orthopedic surgeons for evaluation and  probable right shoulder replacement.  She has failed rotator cuff surgeries  bilaterally in the past and is interested in a right shoulder replacement.  Will see if Dr. Stark Jock group can manage her once she has her insurance in  December.  In the meantime, will continue the two medicines with refills as  noted above.           ______________________________  Ellwood Dense, M.D.     DC/MedQ  D:  03/10/2006 09:41:55  T:  03/10/2006 09:55:52   Job #:  [id number]

## 2010-12-18 NOTE — Assessment & Plan Note (Signed)
HISTORY OF PRESENT ILLNESS:  Melissa [name] returns to clinic today for followup  evaluation.  She does have an insurance card that covers her for medicines  at present.  That card is apparently not going to cover her for any pre-  existing injury until July 02, 2006.  We have taken her card and will try  to make a referral to Dr. Stark Jock group for evaluation.  The patient has  had bilateral rotator cuff repairs in the past for avascular necrosis, which  have been unsuccessful in relieving her pain.  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  She understands that we will try to set her up  with Dr. Teressa Senter or whomever in that office who has done the most shoulder  replacement surgeries as that likely is what she is in need of.  Will  continue the pain medicines for her until that time and then gradually wean  her once she has surgery in the future.   MEDICATIONS:  1. __________  20 mg daily.  2. Oxycodone 5 mg 4 tablets p.o. t.i.d. p.r.n. (approximately 12 per day).  3. OxyContin sustained release 20 mg b.i.d.   REVIEW OF SYSTEMS:  Noncontributory.   PHYSICAL EXAMINATION:  GENERAL:  Well appearing thin, adult female in mild  to no acute distress.  VITAL SIGNS:  Blood pressure:  132/77 with pulse 97, respiratory rate 16 and  O2 saturation 99% on room air.  EXTREMITIES:  She has 4-/5 strength in bilateral upper extremities.  She has  significant problems raising her right shoulder above mid-chest level.   IMPRESSION:  Chronic right shoulder pain with history of bilateral rotator  cuff repairs and avascular necrosis of the right shoulder.  At the present  time we have refilled her OxyContin sustained release and her oxycodone  immediate release.  Will plan on continuing the medications through the time  of surgery and probably will need to increase this slightly just after  surgery.  We will then  start a gradual wean of her pain medicines, hopefully  gaining adequate pain relief with the surgery itself.  Hopefully we can get  her set up with Dr. Stark Jock group for her shoulder replacement surgery as  soon as possible in early December.  They apparently are willing to set up  an appointment now that she has an insurance card.  That appointment will  have to be after the first of December as it is a pre-existing condition.   PLAN:  The patient to follow up in this office in approximately 3 months  time with refills to medications prior to appointment if necessary.           ______________________________  Ellwood Dense, M.D.     DC/MedQ  D:  06/09/2006 10:45:39  T:  06/09/2006 16:04:26  Job #:  [id number]

## 2010-12-18 NOTE — Group Therapy Note (Signed)
PURPOSE OF EVALUATION:  Evaluate and treat chronic right shoulder pain.   HISTORY OF PRESENT ILLNESS:  Melissa [name] is a 60 year old left handed female  with history of bilateral shoulder problems dating back to at least June of  2001. The patient's medical records indicate that she underwent a MRI scan  of her right shoulder on January 21, 2000 that showed a complete tear of the  supraspinatus and infraspinatus tendons of the right shoulder. Apparently  the patient was having problems involving both shoulders at that time. She  subsequently was seen by Dr. Hendricks Limes in [place], [place] and underwent  significant rotator cuff repair of the left shoulder done March 02, 2000.  Shortly after that surgery was performed and the patient was able to  recover, she underwent a right rotator cuff repair done on June 14, 2000, again by Dr. Hendricks Limes. This again was performed down in [place]. The  patient reports that after the two surgeries, that she was doing reasonably  well for a year or so. The patient subsequently underwent a MRI scan of her  right shoulder March 25, 2001 because she was having increased problems.  This showed a full thickness supraspinatus tendon tear with retraction. The  patient subsequently went on to have a second opinion with a Careers adviser in  [place]. Both of the surgeons, specifically Dr. Hendricks Limes and the second consult  surgeon recommended a repeat surgery but the patient was not willing to do  that at that time. She wanted to try conservative treatment. She apparently  tried narcotic medications from August of 2002 through August of 2004. She  was only treated with Ultram. She came off all medicines August 2004 through  September 2006. The patient subsequently moved to [place] to be near  family as of June 2006. Approximately September 2006, she fell while in  [place] and injured her right shoulder. She subsequently went to see  her primary care physician, Dr. Andrey Campanile,  and then Dr. Andrey Campanile referred her to  Dr. Madelon Lips. On May 06, 2005, the patient was seen by Dr. Madelon Lips. At  that time, Dr. Madelon Lips recommended shoulder surgery because of avascular  necrosis along with minimal changes of the acromioclavicular joint. At that  time, the patient had no insurance coverage and she wished to put off any  surgery. They did a superacromial injection with Celestone and Marcaine at  that time. The patient was subsequently referred back to Dr. Doristine Counter and Dr.  Andrey Campanile, as Dr. Madelon Lips did not prescribe narcotic medications. On May 19, 2005, the patient was seen by Dr. Doristine Counter. At that time, she required  surgery on her shoulders but no surgery was in place at present. She was  referred to the pain clinic at that time and she was using Percocet 10/325  approximately 4 times a day. Presently the patient reports that the pain  involving her right shoulder is present all of the time but is much worse  with any activity. She has fairly full range of motion  of the right  shoulder according to her reports but has pain with any motion and pain even  at rest at this point. She continues to work as a Scientist, physiological and feels  that she will have some medical coverage as of April of 2007.   PAST MEDICAL HISTORY:  1.  History of hepatitis C.  2.  Depression.  3.  History of bilateral rotator cuff, the left done March 02, 2000 and  the      right done June 14, 2000 by Dr. Hendricks Limes in [place], [place].  4.  Prior appendectomy.  5.  Prior cholecystectomy.   REVIEW OF SYSTEMS:  No diabetes mellitus.   ALLERGIES:  NO KNOWN DRUG ALLERGIES.   SOCIAL HISTORY:  The patient is married. Smokes 1/2 pack of cigarettes per  day. She does not use alcohol. She is the Interior and spatial designer of [employer].   MEDICATIONS:  1.  Lexapro 20 mg daily.  2.  Percocet 10/325 1 tablet q.i.d. p.r.n.   REVIEW OF SYSTEMS:  Noncontributory.   PHYSICAL EXAMINATION:  GENERAL:   A well appearing, fit, adult female in mild  acute discomfort.  VITAL SIGNS:  Blood pressure 149/91 with a pulse of 85, respiratory rate 16,  and O2 saturation 98% on room air.  NEUROLOGIC:  Right upper extremity range of motion  was within functional  limits with complaints of pain throughout. She had strength of approximately  4 to 4+ over 5 throughout the right upper extremity and 4+ throughout the  left upper extremity. Bulk and tone were normal. Reflexes were 2+ and  symmetrical. Sensation was intact to light touch throughout the bilateral  upper extremities.   IMPRESSION:  Chronic right shoulder pain with history of bilateral rotator  cuff repairs in 2001.   PLAN:  At the present time, I have told her that once she finds out from her  insurance carrier where the preferred location would be for future surgery,  then we will seek the name of a specific surgeon in that area. She seems to  be leaning towards [facility] and I will have to check with my colleagues  to see who might be the best person, not just for a rotator cuff repair but  probable shoulder arthroplasty on the right side. In the meantime, will use  Percocet 10/325 1 tablet p.o. q.i.d. We have called the pharmacy and this  seems to be the least expensive course in terms of medication at this point.  It does give her good relief at the present time. We have also give her  samples of Lyrica at 75 mg to be taken 1 tablet p.o. q.h.s. Hopefully, this  will allow her to decrease the use of the Percocet, but we will see how she  does on the Lyrica. We will plan on seeing her in followup in approximately  1 months time.           ______________________________  Ellwood Dense, M.D.     DC/MedQ  D:  08/16/2005 12:06:45  T:  08/16/2005 21:02:12  Job #:  [id number]   cc:   Gloriajean Dell. Andrey Campanile, M.D.  Fax: [phone]

## 2010-12-18 NOTE — Assessment & Plan Note (Signed)
Ms. Soucek to clinic today for followup evaluation.  She reports that  she is doing reasonably well overall.  She does need refill on her  medicines over the new few days to weeks.  She feels that she may be  overusing her left shoulder trying to compensate for her right shoulder.  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  She reports that she is comfortable  with continuing on the same dose of medicines at this time.   MEDICATIONS:  1. Lexapro 20 mg daily.  2. Oxycodone 5 mg 3 tablets q.i.d. versus 4 tablets t.i.d., for a      total of 12 per day.  3. Oxycodone, sustained-release, 40 mg b.i.d.   REVIEW OF SYSTEMS:  Noncontributory.   PHYSICAL EXAMINATION:  GENERAL:  Well appearing, middle-aged adult  female in mild acute discomfort.  VITAL SIGNS:  Blood pressure 123/76, pulse 79, respiratory rate 18,  oxygen saturation 98% on room air.  MUSCULOSKELETAL:  Patient has 4/5 strength in the bilateral upper  extremities, 5/5 strength in the lower extremities.   IMPRESSION:  Chronic right shoulder pain with history of bilateral  rotator cuff repairs and avascular necrosis of the right shoulder.   In the office today we did refill the patient's oxycodone sustained-  release and her immediate-release, each as of mid-May 2008.  Will plan  on seeing the patient in followup in approximately 4 months' time, with  refills prior to that appointment as necessary.           ______________________________  Ellwood Dense, M.D.     DC/MedQ  D:  12/08/2006 11:09:55  T:  12/08/2006 11:29:05  Job #:  [id number]

## 2010-12-18 NOTE — Assessment & Plan Note (Signed)
DATE OF EVALUATION:  September 10, 2005.   HISTORY OF PRESENT ILLNESS:  Ms. Worrall returns to the clinic today for  followup evaluation.  She was first and last seen in this office, August 16, 2005, for evaluation of right shoulder pain.  She has had prior rotator  cuff surgeries at least x2 involving her right shoulder down in [place].  She has been told recently that she should consider shoulder arthroplasty on  the right side, but she is not with insurance at the present time.  She  would like to set that up for an evaluation for shoulder arthroplasty, and  she plans to have that done in [place] if possible.  She will probably  have insurance as of April of 2007.   The patient reports that in the meantime she gets good relief from the  Percocet 10/325.  She generally uses one-and-a-half tablets four times per  day and needs a refill in the office today.  She reports that she is able to  do her work and she now presently works as a Scientist, physiological at [employer].  She does not have to do any heavy lifting at that job.  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   MEDICATIONS:  1.  Lexapro 20 mg daily.  2.  Percocet 10/325 one-and-a-half tablets q.i.d. p.r.n.   PHYSICAL EXAMINATION:  GENERAL:  A well-appearing, thin, adult female in  mild to no acute discomfort.  VITAL SIGNS:  Blood pressure 128/84 with a pulse of 111, respiratory rate  16, and O2 saturation 100% on room air.  NEUROLOGIC:  The patient continues to have 4-/5 strength throughout the  bilateral upper extremities.  Bulk and tone were normal.  Reflexes were 2+  and symmetrical.   IMPRESSION:  Chronic right shoulder pain with a history of bilateral rotator  cuff repairs in 2001.   PLAN:  At the present time, we have refilled the patient's Percocet.  We  have also contacted Dr. Teressa Senter.  He does do shoulder arthroplasty.  We have  set her up for an appointment with Dr. Teressa Senter, November 08, 2005.  She will have  insurance at that time and they can repeat MRA studies as appropriate, and  she can be counseled regarding any surgery apart from arthroplasty that  would be appropriate for her.  In the meantime, we will continue to fill her  Percocet on a monthly basis until definitive surgery is completed or her  pain resolves.           ______________________________  Ellwood Dense, M.D.     DC/MedQ  D:  09/10/2005 16:04:15  T:  09/11/2005 14:15:24  Job #:  [id number]

## 2010-12-18 NOTE — Assessment & Plan Note (Signed)
Ms. Slape returns to clinic today for follow-up evaluation.  During the  last clinic visit we had tried to eliminate the Tylenol that she was taking  with her Percocet and instead use oxycodone.  She has been using the  oxycodone 5 mg and generally takes three tablets four times a day for a  total of 12 per day.  She reports that her pain generally is a 3-7/10.  We  have discussed possible slow release medication to avoid the high amounts of  pills that she is taking.  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  She is still  eventually awaiting word from her insurance company that she can proceed  with a shoulder replacement for the future.   MEDICATIONS:  1.  Lexapro 20 mg daily.  2.  Oxycodone 5 mg three tablets p.o. q.i.d. p.r.n. (approximately 10-12 per      day).   REVIEW OF SYSTEMS:  Noncontributory.   PHYSICAL EXAMINATION:  GENERAL:  Well-appearing, fit adult female in  moderate acute discomfort.  VITAL SIGNS:  Blood pressure 127/82 with a pulse of 117, respiratory rate  16, O2 saturation 98% on room air.  NEUROLOGIC:  She has 4-/5 strength in the bilateral upper extremities.  She  has severe problems raising her right arm above shoulder height.   IMPRESSION:  Chronic right shoulder pain with history of bilateral rotator  cuff repairs and avascular necrosis of the right shoulder.   In the office today we did add OxyContin sustained release 20 mg one tablet  q.12h. to her pain regimen.  Hopefully we can decrease the amount of  immediate-release oxycodone she is using by adding the slow release  medication.  We have given her a slip for the oxycodone immediate-release  approximately six per day as needed.  Both of those prescriptions were  written for today, Dec 09, 2005.  Will make adjustments as necessary.  So  far her insurance seems to be paying for her pain medicines at this point.   Will plan on seeing her as  previously scheduled in August 2007 with refills  prior to that appointment as necessary.           ______________________________  Ellwood Dense, M.D.     DC/MedQ  D:  12/09/2005 11:45:02  T:  12/10/2005 10:33:26  Job #:  [id number]

## 2011-04-26 ENCOUNTER — Ambulatory Visit: Payer: Self-pay

## 2011-05-03 ENCOUNTER — Ambulatory Visit
Admission: RE | Admit: 2011-05-03 | Discharge: 2011-05-03 | Disposition: A | Discharge status: Home / Self Care | Payer: BC Managed Care – PPO | Source: Ambulatory Visit | Attending: Obstetrics & Gynecology | Admitting: Obstetrics & Gynecology

## 2011-05-03 DIAGNOSIS — Z1231 Encounter for screening mammogram for malignant neoplasm of breast: Secondary | ICD-10-CM

## 2011-05-19 LAB — COMPREHENSIVE METABOLIC PANEL
AST: 45 — ABNORMAL HIGH
Albumin: 3.3 — ABNORMAL LOW
Calcium: 8.3 — ABNORMAL LOW
Creatinine, Ser: 0.65
GFR calc Af Amer: >60
GFR calc non Af Amer: >60
Sodium: 138
Total Protein: 6

## 2011-05-19 LAB — CBC
MCHC: 34.2
MCV: 93.8
Platelets: 104 — ABNORMAL LOW

## 2011-05-19 LAB — HEPATITIS PANEL, ACUTE
HCV Ab: POSITIVE — AB
Hep A IgM: NEGATIVE

## 2011-05-19 LAB — CLOSTRIDIUM DIFFICILE EIA: C difficile Toxins A+B, EIA: NEGATIVE

## 2011-05-19 LAB — GIARDIA/CRYPTOSPORIDIUM SCREEN(EIA): Cryptosporidium Screen (EIA): NEGATIVE

## 2011-05-19 LAB — STOOL CULTURE

## 2011-05-20 LAB — URINALYSIS, ROUTINE W REFLEX MICROSCOPIC
Glucose, UA: NEGATIVE
Ketones, ur: NEGATIVE
Nitrite: NEGATIVE
Specific Gravity, Urine: 1.006
pH: 7

## 2011-05-20 LAB — RAPID URINE DRUG SCREEN, HOSP PERFORMED
Barbiturates: NOT DETECTED
Benzodiazepines: NOT DETECTED
Cocaine: NOT DETECTED
Opiates: NOT DETECTED

## 2011-05-20 LAB — HEPATIC FUNCTION PANEL
ALT: 68 — ABNORMAL HIGH
Alkaline Phosphatase: 41
Bilirubin, Direct: 0.5 — ABNORMAL HIGH
Indirect Bilirubin: 1.5 — ABNORMAL HIGH
Total Bilirubin: 2 — ABNORMAL HIGH
Total Protein: 6.5

## 2011-05-20 LAB — I-STAT 8, (EC8 V) (CONVERTED LAB)
BUN: 10
Bicarbonate: 24.1 — ABNORMAL HIGH
Hemoglobin: 17.7 — ABNORMAL HIGH
Operator id: 282201
pCO2, Ven: 37.9 — ABNORMAL LOW

## 2011-05-20 LAB — HEMOGLOBIN AND HEMATOCRIT, BLOOD: Hemoglobin: 16.6 — ABNORMAL HIGH

## 2011-05-20 LAB — LIPASE, BLOOD: Lipase: 24

## 2012-06-02 ENCOUNTER — Other Ambulatory Visit: Payer: Self-pay | Admitting: Obstetrics & Gynecology

## 2012-06-02 DIAGNOSIS — Z1231 Encounter for screening mammogram for malignant neoplasm of breast: Secondary | ICD-10-CM

## 2012-07-04 ENCOUNTER — Ambulatory Visit: Payer: BC Managed Care – PPO

## 2012-08-14 ENCOUNTER — Ambulatory Visit: Payer: BC Managed Care – PPO

## 2012-08-15 ENCOUNTER — Ambulatory Visit
Admission: RE | Admit: 2012-08-15 | Discharge: 2012-08-15 | Disposition: A | Discharge status: Home / Self Care | Payer: BC Managed Care – PPO | Source: Ambulatory Visit | Attending: Obstetrics & Gynecology | Admitting: Obstetrics & Gynecology

## 2012-08-15 DIAGNOSIS — Z1231 Encounter for screening mammogram for malignant neoplasm of breast: Secondary | ICD-10-CM

## 2012-08-18 ENCOUNTER — Other Ambulatory Visit: Payer: Self-pay | Admitting: Obstetrics & Gynecology

## 2012-08-18 DIAGNOSIS — R928 Other abnormal and inconclusive findings on diagnostic imaging of breast: Secondary | ICD-10-CM

## 2012-08-23 ENCOUNTER — Ambulatory Visit
Admission: RE | Admit: 2012-08-23 | Discharge: 2012-08-23 | Disposition: A | Discharge status: Home / Self Care | Payer: BC Managed Care – PPO | Source: Ambulatory Visit | Attending: Obstetrics & Gynecology | Admitting: Obstetrics & Gynecology

## 2012-08-23 DIAGNOSIS — R928 Other abnormal and inconclusive findings on diagnostic imaging of breast: Secondary | ICD-10-CM

## 2012-10-26 ENCOUNTER — Telehealth: Payer: Self-pay | Admitting: Physician Assistant

## 2012-10-26 NOTE — Telephone Encounter (Signed)
Need to print and sign written rx's for these

## 2012-10-26 NOTE — Telephone Encounter (Signed)
Oxycodone 5mg  Q6hr PRN           Fentanyl Patch Q 3 days. Last OV 09/01/2012  Last refill both Rx's 09/29/2012 Need approval for controlled medication.

## 2012-10-26 NOTE — Telephone Encounter (Signed)
Both are approved. May give same amount as given on last Rx.

## 2012-10-27 MED ORDER — OXYCODONE HCL 5 MG PO TABS: 5.0000 mg | ORAL_TABLET | ORAL | Status: DC | PRN

## 2012-10-27 MED ORDER — FENTANYL 25 MCG/HR TD PT72: 1.0000 | MEDICATED_PATCH | TRANSDERMAL | Status: DC

## 2012-10-27 NOTE — Telephone Encounter (Signed)
Pt call to come pick up Rx

## 2012-10-27 NOTE — Telephone Encounter (Signed)
I have printed Rxes for fentanyl and oxycodone. Call pt to pick up.

## 2012-11-27 ENCOUNTER — Telehealth: Payer: Self-pay | Admitting: Physician Assistant

## 2012-11-28 MED ORDER — FENTANYL 25 MCG/HR TD PT72: 1.0000 | MEDICATED_PATCH | TRANSDERMAL | Status: DC

## 2012-11-28 MED ORDER — OXYCODONE HCL 5 MG PO TABS: 5.0000 mg | ORAL_TABLET | ORAL | Status: DC | PRN

## 2012-11-28 NOTE — Telephone Encounter (Signed)
Kim  Can you print these for me to sign in the morning

## 2012-11-28 NOTE — Telephone Encounter (Signed)
Rx's printed for provider signature.  Pt called left mess they will be ready for pick up tomorrow AM

## 2012-12-26 ENCOUNTER — Telehealth: Payer: Self-pay | Admitting: Physician Assistant

## 2012-12-26 MED ORDER — FENTANYL 25 MCG/HR TD PT72: 1.0000 | MEDICATED_PATCH | TRANSDERMAL | Status: DC

## 2012-12-26 MED ORDER — OXYCODONE HCL 5 MG PO TABS: 5.0000 mg | ORAL_TABLET | ORAL | Status: DC | PRN

## 2012-12-26 MED ORDER — BUPROPION HCL ER (XL) 150 MG PO TB24: 150.0000 mg | ORAL_TABLET | Freq: Every day | ORAL | Status: DC

## 2012-12-26 NOTE — Telephone Encounter (Signed)
LOV note indicates that Wellbutrin dose increased to 300mg XL. Need to verify with patient which dose she is taking and needs refilled.

## 2012-12-26 NOTE — Telephone Encounter (Signed)
Last refill all three meds 11/28/12.  Last office visit 09/01/12.  Refills appropriate.  Will office visit before next refill.

## 2012-12-27 MED ORDER — BUPROPION HCL ER (XL) 300 MG PO TB24: 300.0000 mg | ORAL_TABLET | Freq: Every day | ORAL | Status: DC

## 2012-12-27 NOTE — Telephone Encounter (Signed)
Pt called back and verified dose was changed to 300mg .  #00mg  Rx sent to pharmacy. Pt aware narcotic refills ready for pick up at front desk.  follow up appt made for end of July

## 2013-01-23 ENCOUNTER — Telehealth: Payer: Self-pay | Admitting: Physician Assistant

## 2013-01-23 MED ORDER — FENTANYL 25 MCG/HR TD PT72: 1.0000 | MEDICATED_PATCH | TRANSDERMAL | Status: DC

## 2013-01-23 MED ORDER — OXYCODONE HCL 5 MG PO TABS: 5.0000 mg | ORAL_TABLET | ORAL | Status: DC | PRN

## 2013-01-23 NOTE — Telephone Encounter (Signed)
6 mth f/u appt 02/19/13.  Rx printed for provider signature

## 2013-01-24 NOTE — Telephone Encounter (Signed)
Approved. Signed.

## 2013-02-19 ENCOUNTER — Encounter: Payer: Self-pay | Admitting: Physician Assistant

## 2013-02-19 ENCOUNTER — Ambulatory Visit (INDEPENDENT_AMBULATORY_CARE_PROVIDER_SITE_OTHER): Payer: BC Managed Care – PPO | Admitting: Physician Assistant

## 2013-02-19 VITALS — BP 132/84 | HR 84 | Temp 98.3°F | Resp 18 | Ht 66.0 in | Wt 131.0 lb

## 2013-02-19 DIAGNOSIS — F411 Generalized anxiety disorder: Secondary | ICD-10-CM

## 2013-02-19 DIAGNOSIS — K7460 Unspecified cirrhosis of liver: Secondary | ICD-10-CM

## 2013-02-19 DIAGNOSIS — M25519 Pain in unspecified shoulder: Secondary | ICD-10-CM

## 2013-02-19 DIAGNOSIS — G8929 Other chronic pain: Secondary | ICD-10-CM

## 2013-02-19 DIAGNOSIS — K759 Inflammatory liver disease, unspecified: Secondary | ICD-10-CM

## 2013-02-19 DIAGNOSIS — M25511 Pain in right shoulder: Secondary | ICD-10-CM | POA: Insufficient documentation

## 2013-02-19 DIAGNOSIS — F419 Anxiety disorder, unspecified: Secondary | ICD-10-CM

## 2013-02-19 DIAGNOSIS — F329 Major depressive disorder, single episode, unspecified: Secondary | ICD-10-CM | POA: Insufficient documentation

## 2013-02-19 NOTE — Progress Notes (Signed)
Patient ID: ETTA GASSETT MRN: [id number], DOB: 04-08-1951, 62 y.o. Date of Encounter: @DATE @  Chief Complaint:  Chief Complaint  Patient presents with  . Anxiety  . Shoulder Pain    HPI: 62 y.o. year old female  presents for routine f/u OV.  1- Liver: Says she still goes to [facility] every 3-6 months for f/u of this. They do necessary labs, MRIs etc. She never consumed alcohol. 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 Has had no trauma, etc to flare it up. Pain is controlled iwht current meds but does not think she can decrease them further.   3- Anxiety/Depression: At LOV 1/14 shee reported that she was feeling both anxious and depressed despite being on Wellbutrin 150mg . Wellbutrin was increased to 300mg . She IS taking this increased dose. However,she says she still feels anxious. Says "if stays busy at work she is ok but whne not working, struggles with this." Doesnot really feel depressed-this seems controlled pretty well but feels anxious.     Past Medical History  Diagnosis Date  . Hepatitis     sees Dr Huston Foley @ [facility]-has MRI Q 6 months  . Cirrhosis     sees Dr Huston Foley @ [facility]. Gets MRI Q 6 mos  . Chronic shoulder pain     h/o 3 surgeries on right shoulder. h/o 1 surgery on left shoulder  . Anxiety   . Depression      Home Meds: See attached medication section for current medication list. Any medications entered into computer today will not appear on this note's list. The medications listed below were entered prior to today. Current Outpatient Prescriptions on File Prior to Visit  Medication Sig Dispense Refill  . buPROPion (WELLBUTRIN XL) 300 MG 24 hr tablet Take 1 tablet (300 mg total) by mouth daily.  30 tablet  1  . fentaNYL (DURAGESIC - DOSED MCG/HR) 25 MCG/HR Place 1 patch (25 mcg total) onto the skin every 3 (three) days.  10 patch  0  . oxyCODONE (OXY IR/ROXICODONE) 5 MG immediate release tablet Take 1 tablet  (5 mg total) by mouth every 4 (four) hours as needed for pain.  60 tablet  0   No current facility-administered medications on file prior to visit.    Allergies: No Known Allergies  History   Social History  . Marital Status: Married    Spouse Name: N/A    Number of Children: N/A  . Years of Education: N/A   Occupational History  . Not on file.   Social History Main Topics  . Smoking status: Not on file  . Smokeless tobacco: Not on file  . Alcohol Use: Not on file  . Drug Use: Not on file  . Sexually Active: Not on file   Other Topics Concern  . Not on file   Social History Narrative  . No narrative on file    No family history on file.   Review of Systems:  See HPI for pertinent ROS. All other ROS negative.    Physical Exam: Blood pressure 132/84, pulse 84, temperature 98.3 F (36.8 C), resp. rate 18, height 5\' 6"  (1.676 m), weight 131 lb (59.421 kg)., Body mass index is 21.15 kg/(m^2). General: WNWD WF.Appears in no acute distress. Neck: Supple. No thyromegaly. No lymphadenopathy. Lungs: Clear bilaterally to auscultation without wheezes, rales, or rhonchi. Breathing is unlabored. Heart: RRR with S1 S2. No murmurs, rubs, or gallops. Extremities/Skin: Warm and  dry.  No edema. Neuro: Alert and oriented X 3. Moves all extremities spontaneously. Gait is normal. CNII-XII grossly in tact. Psych:  Responds to questions appropriately with a normal affect.     ASSESSMENT AND PLAN:  62 y.o. year old female with  1. Anxiety Not well controlled. Add Lexapro 10mg  QD. Eiscusssed proper use/expectations of med. If adv effects, call immediately. O/W takes weeks to take effect. F/U iwht me in 6-8 weeks.  Cont Wellbutrin 300mg .  EPIC WAS DOWN AT TIME OF HER OV. RXES WERE HAND WRITTEN.  2. Depression See # 1 above.  3. Chronic right shoulder pain Stable. Cont current meds.  EPIC WAS DOWN AT TIME OF HER OV. HANDWRITTEN RXES WERE GIVEN FOR # 60 OXYCODONE 5MG  AND    # 10  FENTANYL PATCH 25MG .  4. Hepatitis Managed by [facility]. Pt is EXTREMELY cautious about any meds tha tmay affect liver. She is going to even check with [facility] prior to starting Lexapro.  5. Cirrhosis See # 4.    [address] [place], [place], [facility] 02/19/2013 11:29 AM

## 2013-03-20 ENCOUNTER — Telehealth: Payer: Self-pay | Admitting: Physician Assistant

## 2013-03-20 NOTE — Telephone Encounter (Signed)
Last refill 01/23/13  Last OV 02/19/13  Need approval for controlled medication.

## 2013-03-21 MED ORDER — FENTANYL 25 MCG/HR TD PT72: 1.0000 | MEDICATED_PATCH | TRANSDERMAL | Status: DC

## 2013-03-21 MED ORDER — OXYCODONE HCL 5 MG PO TABS: 5.0000 mg | ORAL_TABLET | ORAL | Status: DC | PRN

## 2013-03-21 NOTE — Telephone Encounter (Signed)
Approved. Please Print and I will sign.

## 2013-03-21 NOTE — Telephone Encounter (Signed)
RXs printed

## 2013-04-20 ENCOUNTER — Telehealth: Payer: Self-pay | Admitting: Physician Assistant

## 2013-04-20 NOTE — Telephone Encounter (Signed)
Ok to refill 

## 2013-04-20 NOTE — Telephone Encounter (Signed)
Forward to PCP.

## 2013-04-20 NOTE — Telephone Encounter (Signed)
Fentanyl 25 mcg/hr patch 1 q3 days Oxycodone 5 mg 1 q4 hours prn pain

## 2013-04-23 NOTE — Telephone Encounter (Signed)
Kim please print and I will sign

## 2013-04-25 MED ORDER — OXYCODONE HCL 5 MG PO TABS: 5.0000 mg | ORAL_TABLET | ORAL | Status: DC | PRN

## 2013-04-25 MED ORDER — FENTANYL 25 MCG/HR TD PT72: 1.0000 | MEDICATED_PATCH | TRANSDERMAL | Status: DC

## 2013-04-25 NOTE — Telephone Encounter (Addendum)
RX printed for signature.  Left pt mess rx's ready for pick up

## 2013-04-25 NOTE — Addendum Note (Signed)
Addended by: Donne Anon on: 04/25/2013 09:43 AM   Modules accepted: Orders

## 2013-05-21 ENCOUNTER — Telehealth: Payer: Self-pay | Admitting: Physician Assistant

## 2013-05-21 NOTE — Telephone Encounter (Signed)
Patient needs her Fentanyl patch ,OxyCodone refilled.

## 2013-05-21 NOTE — Telephone Encounter (Signed)
Last refill 9/24.  Last OV 7/21  OK refill?

## 2013-05-23 MED ORDER — FENTANYL 25 MCG/HR TD PT72: 1.0000 | MEDICATED_PATCH | TRANSDERMAL | Status: DC

## 2013-05-23 NOTE — Telephone Encounter (Signed)
rx printed

## 2013-05-23 NOTE — Telephone Encounter (Signed)
Approved. Please print for my signature.

## 2013-05-24 MED ORDER — OXYCODONE HCL 5 MG PO TABS: 5.0000 mg | ORAL_TABLET | ORAL | Status: DC | PRN

## 2013-05-24 NOTE — Telephone Encounter (Signed)
Oxy Rx printed as well.

## 2013-05-24 NOTE — Telephone Encounter (Signed)
Message copied by Donne Anon on Thu May 24, 2013  9:30 AM ------      Message from: [facility], Lindalou Hose      Created: Thu May 24, 2013  8:52 AM      Regarding: Rx refill      Contact: [phone]       She also needs Oxycodone Rx ------

## 2013-06-20 ENCOUNTER — Telehealth: Payer: Self-pay | Admitting: Physician Assistant

## 2013-06-20 MED ORDER — FENTANYL 25 MCG/HR TD PT72: 25.0000 ug | MEDICATED_PATCH | TRANSDERMAL | Status: DC

## 2013-06-20 MED ORDER — OXYCODONE HCL 5 MG PO TABS: 5.0000 mg | ORAL_TABLET | ORAL | Status: DC | PRN

## 2013-06-20 NOTE — Telephone Encounter (Signed)
.?   OK to Refill  Last refill 05/23/13

## 2013-06-20 NOTE — Telephone Encounter (Signed)
Meds refilled.

## 2013-06-20 NOTE — Telephone Encounter (Signed)
Needs Fentanyl patch and Oxycodone 5mg  Rx's

## 2013-06-20 NOTE — Telephone Encounter (Signed)
Approved for refill of each of these.  May give #10 with 0 refills of the fentanyl.  May give #60 with 0 refills of the oxycodone.

## 2013-07-19 ENCOUNTER — Telehealth: Payer: Self-pay | Admitting: Physician Assistant

## 2013-07-19 NOTE — Telephone Encounter (Signed)
Needs Fentanyl patch Rx and Oxycodone Rx

## 2013-07-20 MED ORDER — FENTANYL 25 MCG/HR TD PT72: 25.0000 ug | MEDICATED_PATCH | TRANSDERMAL | Status: DC

## 2013-07-20 MED ORDER — OXYCODONE HCL 5 MG PO TABS: 5.0000 mg | ORAL_TABLET | ORAL | Status: DC | PRN

## 2013-07-20 NOTE — Telephone Encounter (Signed)
Monthly refills appropriate.  Will need office visit for next refill.  Routine 6 month visit.  Rx printed for provider signature. Called patient to tell her RX's ready and office closing at 1pm

## 2013-07-20 NOTE — Telephone Encounter (Signed)
ok 

## 2013-07-25 ENCOUNTER — Other Ambulatory Visit: Payer: Self-pay | Admitting: Physician Assistant

## 2013-07-27 ENCOUNTER — Encounter: Payer: Self-pay | Admitting: Family Medicine

## 2013-07-27 NOTE — Telephone Encounter (Signed)
Medication refill for one time only.  Patient needs to be seen.  Letter sent for patient to call and schedule 

## 2013-08-15 ENCOUNTER — Ambulatory Visit: Payer: BC Managed Care – PPO | Admitting: Physician Assistant

## 2013-08-20 ENCOUNTER — Encounter: Payer: Self-pay | Admitting: Physician Assistant

## 2013-08-20 ENCOUNTER — Ambulatory Visit (INDEPENDENT_AMBULATORY_CARE_PROVIDER_SITE_OTHER): Payer: BC Managed Care – PPO | Admitting: Physician Assistant

## 2013-08-20 VITALS — BP 142/76 | HR 80 | Temp 98.1°F | Resp 20 | Wt 136.0 lb

## 2013-08-20 DIAGNOSIS — G8929 Other chronic pain: Secondary | ICD-10-CM

## 2013-08-20 DIAGNOSIS — K7460 Unspecified cirrhosis of liver: Secondary | ICD-10-CM

## 2013-08-20 DIAGNOSIS — M25511 Pain in right shoulder: Secondary | ICD-10-CM

## 2013-08-20 DIAGNOSIS — F411 Generalized anxiety disorder: Secondary | ICD-10-CM

## 2013-08-20 DIAGNOSIS — F419 Anxiety disorder, unspecified: Secondary | ICD-10-CM

## 2013-08-20 DIAGNOSIS — M25519 Pain in unspecified shoulder: Secondary | ICD-10-CM

## 2013-08-20 DIAGNOSIS — F3289 Other specified depressive episodes: Secondary | ICD-10-CM

## 2013-08-20 DIAGNOSIS — F329 Major depressive disorder, single episode, unspecified: Secondary | ICD-10-CM

## 2013-08-20 DIAGNOSIS — F32A Depression, unspecified: Secondary | ICD-10-CM

## 2013-08-20 DIAGNOSIS — K759 Inflammatory liver disease, unspecified: Secondary | ICD-10-CM

## 2013-08-20 MED ORDER — FENTANYL 25 MCG/HR TD PT72: 25.0000 ug | MEDICATED_PATCH | TRANSDERMAL | Status: DC

## 2013-08-20 MED ORDER — OXYCODONE HCL 5 MG PO TABS: 5.0000 mg | ORAL_TABLET | Freq: Two times a day (BID) | ORAL | Status: DC

## 2013-08-20 MED ORDER — FENTANYL 25 MCG/HR TD PT72
25.0000 ug | MEDICATED_PATCH | TRANSDERMAL | Status: DC
Start: 2013-08-20 — End: 2013-11-15

## 2013-08-20 MED ORDER — BUPROPION HCL ER (XL) 300 MG PO TB24: ORAL_TABLET | ORAL | Status: DC

## 2013-08-20 NOTE — Progress Notes (Signed)
Patient ID: Melissa [name] MRN: [id number], DOB: February 10, 1951, 63 y.o. Date of Encounter: 08/20/2013, 9:29 AM    Chief Complaint:  Chief Complaint  Patient presents with  . 6 mth check up    not fasting, med refills     HPI: 63 y.o. year old white female is here for routine followup.  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  Today she is very excited to let me know that she is now in a clinical trial at [facility]. She knows that she is receiving 2 medications which are on her medicine list as below. This is regarding her hepatitis. She says that her viral load is now down to 20. States that when she started the trial she was instructed that she could not start new medications or make any changes in medicines. Says she's not even allowed to take any type of vitamins or anything different than that whatever she was taking at the time of the start of the trial. Says that they do 14 vials of blood work every 2 weeks as well as EKGs etc.  Her pain is well-controlled on the oxycodone 5 mg twice a day and Fentanyl 25 mcg patches. Again, she says that all medicines have to remain stable and the same while she is in a clinical trial. She has a history of chronic shoulder pain. This is well documented in detail in prior notes of mine. She has undergone multiple surgeries and procedures and has been told that nothing more can be done surgically.  At her last visit with me 02/19/13 she was still having some symptoms of anxiety and depression despite being on Wellbutrin. At that visit we were going to add Lexapro. However, she states that she never added the Lexapro because that's when she was getting ready to start clinical trial. Again she was not allowed to make any medication adjustments while in the trial. She says that her mood is been stable and controlled with Wellbutrin. She says that she thinks that some of this is because she knows that she is getting better in regards to her liver numbers  and the medications that she is on in this trial.     Home Meds: See attached medication section for any medications that were entered at today's visit. The computer does not put those onto this list.The following list is a list of meds entered prior to today's visit.   Current Outpatient Prescriptions on File Prior to Visit  Medication Sig Dispense Refill  . fentaNYL (DURAGESIC - DOSED MCG/HR) 25 MCG/HR patch Place 1 patch (25 mcg total) onto the skin every 3 (three) days.  10 patch  0  . oxyCODONE (OXY IR/ROXICODONE) 5 MG immediate release tablet Take 1 tablet (5 mg total) by mouth every 4 (four) hours as needed.  60 tablet  0   No current facility-administered medications on file prior to visit.    Allergies: No Known Allergies    Review of Systems: See HPI for pertinent ROS. All other ROS negative.    Physical Exam: Blood pressure 142/76, pulse 80, temperature 98.1 F (36.7 C), temperature source Oral, resp. rate 20, weight 136 lb (61.689 kg)., Body mass index is 21.96 kg/(m^2). General: WNWD WF. Appears in no acute distress. Neck: Supple. No thyromegaly. No lymphadenopathy. Lungs: Clear bilaterally to auscultation without wheezes, rales, or rhonchi. Breathing is unlabored. Heart: Regular rhythm. No murmurs, rubs, or gallops. Abdomen: Soft, non-tender, non-distended with normoactive bowel sounds. No hepatomegaly. No  rebound/guarding. No obvious abdominal masses. Msk:  Strength and tone normal for age. Extremities/Skin: Warm and dry. No clubbing or cyanosis. No edema. No rashes or suspicious lesions. Neuro: Alert and oriented X 3. Moves all extremities spontaneously. Gait is normal. CNII-XII grossly in tact. Psych:  Responds to questions appropriately with a normal affect.she is in good spirits and mood seems very stable and well controlled today.      ASSESSMENT AND PLAN:  63 y.o. year old female with  1. Anxiety - buPROPion (WELLBUTRIN XL) 300 MG 24 hr tablet; TAKE ONE (1)  TABLET EACH DAY  Dispense: 30 tablet; Refill: 5  2. Depression - buPROPion (WELLBUTRIN XL) 300 MG 24 hr tablet; TAKE ONE (1) TABLET EACH DAY  Dispense: 30 tablet; Refill: 5  3. Chronic right shoulder pain - oxyCODONE (ROXICODONE) 5 MG immediate release tablet; Take 1 tablet (5 mg total) by mouth 2 (two) times daily.  Dispense: 60 tablet; Refill: 0 - oxyCODONE (ROXICODONE) 5 MG immediate release tablet; Take 1 tablet (5 mg total) by mouth 2 (two) times daily.  Dispense: 60 tablet; Refill: 0 - oxyCODONE (ROXICODONE) 5 MG immediate release tablet; Take 1 tablet (5 mg total) by mouth 2 (two) times daily.  Dispense: 60 tablet; Refill: 0 - fentaNYL (DURAGESIC) 25 MCG/HR patch; Place 1 patch (25 mcg total) onto the skin every 3 (three) days.  Dispense: 10 patch; Refill: 0 - fentaNYL (DURAGESIC) 25 MCG/HR patch; Place 1 patch (25 mcg total) onto the skin every 3 (three) days.  Dispense: 10 patch; Refill: 0 - fentaNYL (DURAGESIC) 25 MCG/HR patch; Place 1 patch (25 mcg total) onto the skin every 3 (three) days.  Dispense: 10 patch; Refill: 0   Patient states that she gets these pain medications at the [facility]. She says that the pharmacy will hold future prescriptions. For each the oxycodone and fentanyl she has one prescription that she can fill now. She has one for "do not fill until 09/20/12. She has another prescription that says "not so until 10/18/12."  When her next prescription is due for April she can call us and we can to prevent a series of 3 more prescriptions of each. Then she will be due for followup visit in 6 months. Follow up sooner if needed. 4. Cirrhosis  5. Hepatitis   Signed, [address] [place], [place], [facility] 08/20/2013 9:29 AM

## 2013-10-11 ENCOUNTER — Other Ambulatory Visit: Payer: Self-pay

## 2013-10-11 ENCOUNTER — Other Ambulatory Visit: Payer: Self-pay | Admitting: Obstetrics & Gynecology

## 2013-10-11 DIAGNOSIS — Z1231 Encounter for screening mammogram for malignant neoplasm of breast: Secondary | ICD-10-CM

## 2013-10-24 ENCOUNTER — Ambulatory Visit: Payer: BC Managed Care – PPO

## 2013-11-09 ENCOUNTER — Ambulatory Visit
Admission: RE | Admit: 2013-11-09 | Discharge: 2013-11-09 | Disposition: A | Discharge status: Home / Self Care | Payer: BC Managed Care – PPO | Source: Ambulatory Visit

## 2013-11-09 DIAGNOSIS — Z1231 Encounter for screening mammogram for malignant neoplasm of breast: Secondary | ICD-10-CM

## 2013-11-15 ENCOUNTER — Telehealth: Payer: Self-pay | Admitting: Family Medicine

## 2013-11-15 DIAGNOSIS — G8929 Other chronic pain: Secondary | ICD-10-CM

## 2013-11-15 DIAGNOSIS — M25511 Pain in right shoulder: Principal | ICD-10-CM

## 2013-11-15 DIAGNOSIS — F329 Major depressive disorder, single episode, unspecified: Secondary | ICD-10-CM

## 2013-11-15 DIAGNOSIS — F419 Anxiety disorder, unspecified: Secondary | ICD-10-CM

## 2013-11-15 DIAGNOSIS — F32A Depression, unspecified: Secondary | ICD-10-CM

## 2013-11-15 NOTE — Telephone Encounter (Signed)
Okay to print prescriptions for the fentanyl and the oxycodone . Each of these can have prescriptions to be filled for 11/18/13, 12/18/13, 01/18/14. The fentanyl is for #10 with 0 refills on each prescription. Oxycodone #60 with 0 refills on each prescription.

## 2013-11-15 NOTE — Telephone Encounter (Signed)
08/20/13 given written refills of Fentanyl and Oxycodone for next three months.  Due 11/18/13.  OK refill? Wellbutrin was refilled for 5 months in January???

## 2013-11-15 NOTE — Telephone Encounter (Signed)
Message copied by Olena Mater on Thu Nov 15, 2013  3:46 PM ------      Message from: Devoria Glassing      Created: Thu Nov 15, 2013  9:38 AM       PATIENT IS CALLING TO GET REFILLS ON HER FENTANYL,OXYCODONE, AND WELLBUTRIN IF POSSIBLE CALL [phone] WITH ANY QUESTIONS  ------

## 2013-11-16 MED ORDER — OXYCODONE HCL 5 MG PO TABS
5.0000 mg | ORAL_TABLET | Freq: Two times a day (BID) | ORAL | Status: DC
Start: 2013-11-16 — End: 2014-02-18

## 2013-11-16 MED ORDER — BUPROPION HCL ER (XL) 300 MG PO TB24: ORAL_TABLET | ORAL | Status: DC

## 2013-11-16 MED ORDER — FENTANYL 25 MCG/HR TD PT72: 25.0000 ug | MEDICATED_PATCH | TRANSDERMAL | Status: DC

## 2013-11-16 MED ORDER — OXYCODONE HCL 5 MG PO TABS: 5.0000 mg | ORAL_TABLET | Freq: Two times a day (BID) | ORAL | Status: DC

## 2013-11-16 NOTE — Telephone Encounter (Signed)
Pt aware can pick up Rx's today

## 2013-11-16 NOTE — Telephone Encounter (Signed)
Yes, I'll sign

## 2014-02-18 ENCOUNTER — Ambulatory Visit (INDEPENDENT_AMBULATORY_CARE_PROVIDER_SITE_OTHER): Payer: BC Managed Care – PPO | Admitting: Physician Assistant

## 2014-02-18 ENCOUNTER — Encounter: Payer: Self-pay | Admitting: Family Medicine

## 2014-02-18 ENCOUNTER — Encounter: Payer: Self-pay | Admitting: Physician Assistant

## 2014-02-18 VITALS — BP 124/76 | HR 80 | Temp 98.1°F | Resp 18 | Wt 140.0 lb

## 2014-02-18 DIAGNOSIS — K759 Inflammatory liver disease, unspecified: Secondary | ICD-10-CM

## 2014-02-18 DIAGNOSIS — M25511 Pain in right shoulder: Secondary | ICD-10-CM

## 2014-02-18 DIAGNOSIS — G8929 Other chronic pain: Secondary | ICD-10-CM

## 2014-02-18 DIAGNOSIS — F329 Major depressive disorder, single episode, unspecified: Secondary | ICD-10-CM

## 2014-02-18 DIAGNOSIS — F32A Depression, unspecified: Secondary | ICD-10-CM

## 2014-02-18 DIAGNOSIS — F411 Generalized anxiety disorder: Secondary | ICD-10-CM

## 2014-02-18 DIAGNOSIS — K7460 Unspecified cirrhosis of liver: Secondary | ICD-10-CM

## 2014-02-18 DIAGNOSIS — F419 Anxiety disorder, unspecified: Secondary | ICD-10-CM

## 2014-02-18 DIAGNOSIS — F3289 Other specified depressive episodes: Secondary | ICD-10-CM

## 2014-02-18 DIAGNOSIS — Z780 Asymptomatic menopausal state: Secondary | ICD-10-CM

## 2014-02-18 DIAGNOSIS — E2839 Other primary ovarian failure: Secondary | ICD-10-CM

## 2014-02-18 DIAGNOSIS — M25519 Pain in unspecified shoulder: Secondary | ICD-10-CM

## 2014-02-18 MED ORDER — FENTANYL 25 MCG/HR TD PT72: 25.0000 ug | MEDICATED_PATCH | TRANSDERMAL | Status: DC

## 2014-02-18 MED ORDER — OXYCODONE HCL 5 MG PO TABS: 5.0000 mg | ORAL_TABLET | Freq: Two times a day (BID) | ORAL | Status: DC

## 2014-02-18 MED ORDER — BUPROPION HCL ER (XL) 300 MG PO TB24: ORAL_TABLET | ORAL | Status: DC

## 2014-02-18 NOTE — Progress Notes (Signed)
Patient ID: Melissa [name] MRN: [id number], DOB: 1950/12/30, 63 y.o. Date of Encounter: 02/18/2014, 9:59 AM    Chief Complaint:  Chief Complaint  Patient presents with  . 6 mth check up    not fasting, has had labs at [facility]     HPI: 63 y.o. year old white female is here for routine followup.  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 She knows that she is receiving 2 medications which are on her medicine list as below. This is regarding her hepatitis. She says that her viral load is now down to 20. States that when she started the trial she was instructed that she could not start new medications or make any changes in medicines. Says she's not even allowed to take any type of vitamins or anything different than that whatever she was taking at the time of the start of the trial. Says that they do 14 vials of blood work every 2 weeks as well as EKGs etc."  TODAY: Today she says that she has completed the clinical trial at [facility]. She is extremely excited that she "sustained nondetectable / cured hepatitis."  Also says that she has found out that if she had paid for the treatment, she would have had to pay copay of  $ 86,000. 00  !!!!!  Because she was in the clinical trial, she received this for free.  Her pain is well-controlled on the oxycodone 5 mg twice a day and Fentanyl 25 mcg patches. She has a history of chronic shoulder pain. This is well documented in detail in prior notes of mine. She has undergone multiple surgeries and procedures and has been told that nothing more can be done surgically.  Her anxiety and depression have been well-controlled with the Wellbutrin at the current dose.   Home Meds:  Outpatient Prescriptions Prior to Visit  Medication Sig Dispense Refill  . buPROPion (WELLBUTRIN XL) 300 MG 24 hr tablet TAKE ONE (1) TABLET EACH DAY  30 tablet   2  . fentaNYL (DURAGESIC) 25 MCG/HR patch Place 1 patch (25 mcg total) onto the skin every 3 (three) days.  10 patch  0  . oxyCODONE (ROXICODONE) 5 MG immediate release tablet Take 1 tablet (5 mg total) by mouth 2 (two) times daily.  60 tablet  0  . ribavirin (REBETOL) 200 MG capsule Take 200 mg by mouth. 3 in the AM,  2 in the PM      . Sofosbuvir 400 MG TABS Take 1 tablet by mouth daily.       No facility-administered medications prior to visit.     Allergies: No Known Allergies    Review of Systems: See HPI for pertinent ROS. All other ROS negative.    Physical Exam: Blood pressure 124/76, pulse 80, temperature 98.1 F (36.7 C), temperature source Oral, resp. rate 18, weight 140 lb (63.504 kg)., Body mass index is 22.61 kg/(m^2). General: WNWD WF. Appears in no acute distress. Neck: Supple. No thyromegaly. No lymphadenopathy. Lungs: Clear bilaterally to auscultation without wheezes, rales, or rhonchi. Breathing is unlabored. Heart: Regular rhythm. No murmurs, rubs, or gallops. Abdomen: Soft, non-tender, non-distended with normoactive bowel sounds. No hepatomegaly. No rebound/guarding. No obvious abdominal masses. Msk:  Strength and tone normal for age. Extremities/Skin: Warm and dry. No clubbing or cyanosis. No edema.  No rashes or suspicious lesions. Neuro: Alert and oriented X 3. Moves all extremities spontaneously. Gait is normal. CNII-XII grossly in tact. Psych:  Responds to questions appropriately with a normal affect.she is in good spirits and mood seems very stable and well controlled today.      ASSESSMENT AND PLAN:  63 y.o. year old female with  1. Anxiety - buPROPion (WELLBUTRIN XL) 300 MG 24 hr tablet; TAKE ONE (1) TABLET EACH DAY  Dispense: 30 tablet; Refill: 5  2. Depression - buPROPion (WELLBUTRIN XL) 300 MG 24 hr tablet; TAKE ONE (1) TABLET EACH DAY  Dispense: 30 tablet; Refill: 5  3. Chronic right shoulder pain - oxyCODONE (ROXICODONE) 5 MG immediate release  tablet; Take 1 tablet (5 mg total) by mouth 2 (two) times daily.  Dispense: 60 tablet; Refill: 0 - oxyCODONE (ROXICODONE) 5 MG immediate release tablet; Take 1 tablet (5 mg total) by mouth 2 (two) times daily.  Dispense: 60 tablet; Refill: 0 - oxyCODONE (ROXICODONE) 5 MG immediate release tablet; Take 1 tablet (5 mg total) by mouth 2 (two) times daily.  Dispense: 60 tablet; Refill: 0 - fentaNYL (DURAGESIC) 25 MCG/HR patch; Place 1 patch (25 mcg total) onto the skin every 3 (three) days.  Dispense: 10 patch; Refill: 0 - fentaNYL (DURAGESIC) 25 MCG/HR patch; Place 1 patch (25 mcg total) onto the skin every 3 (three) days.  Dispense: 10 patch; Refill: 0 - fentaNYL (DURAGESIC) 25 MCG/HR patch; Place 1 patch (25 mcg total) onto the skin every 3 (three) days.  Dispense: 10 patch; Refill: 0   Patient states that she gets these pain medications at the [facility] She says that the pharmacy will hold future prescriptions. For each the oxycodone and fentanyl she has one prescription that she can fill now. She has one for "do not fill until 03/21/14". She has another prescription that says "not fill until 04/21/14."  When her next prescription is due for October she can call us and we can to prevent a series of 3 more prescriptions of each. Then she will be due for followup visit in 6 months. Follow up sooner if needed. 4. Cirrhosis  5. Hepatitis  6. she says that she sees a gynecologist and they have been managing her mammograms. Has been having mammogram each year.  7. DEXA: She had one of these at [facility] in 2007. She is interested in having a followup. She is agreeable to have this at the breast center where she has her mammogram. I have scheduled follow up with [place] today.  8. screening colonoscopy: She says that she had this by Dr. Benson Norway in 2007 and it was normal and she was told to repeat 10 years.  9. Immunizations: Tetanus: She reports that she had this and about 2010 at which time she was  bitten by cat.  10. She says that [facility] has done extensive labs including lipid panel etc. as well as multiple EKGs throughout the time of his clinical trial. She says that she will fax [place] a copy of those results.  11. Routine followup office visit 6 months or sooner if needed.  Signed, [address] [place], [place], [facility] 02/18/2014 9:59 AM

## 2014-05-22 ENCOUNTER — Telehealth: Payer: Self-pay | Admitting: Physician Assistant

## 2014-05-22 DIAGNOSIS — M25511 Pain in right shoulder: Principal | ICD-10-CM

## 2014-05-22 DIAGNOSIS — G8929 Other chronic pain: Secondary | ICD-10-CM

## 2014-05-22 MED ORDER — OXYCODONE HCL 5 MG PO TABS: 5.0000 mg | ORAL_TABLET | Freq: Two times a day (BID) | ORAL | Status: DC

## 2014-05-22 MED ORDER — FENTANYL 25 MCG/HR TD PT72: 25.0000 ug | MEDICATED_PATCH | TRANSDERMAL | Status: DC

## 2014-05-22 NOTE — Telephone Encounter (Signed)
Last OV 02/18/14  3 month refills given then.  Told to call for next 3 refills in October. Due for 6 month visit in January.  RX's printed for provider signature.

## 2014-05-22 NOTE — Telephone Encounter (Signed)
Patient is asking for rx for her fentanyl and oxycodone if possible  [phone] when ready

## 2014-05-22 NOTE — Telephone Encounter (Signed)
Agree. Approved. 3 Rxes of each med printed for 10/21. 11/21, 12/21.

## 2014-05-22 NOTE — Telephone Encounter (Signed)
Pt aware VE'L are ready

## 2014-08-14 ENCOUNTER — Ambulatory Visit (INDEPENDENT_AMBULATORY_CARE_PROVIDER_SITE_OTHER): Payer: BLUE CROSS/BLUE SHIELD | Admitting: Physician Assistant

## 2014-08-14 ENCOUNTER — Encounter: Payer: Self-pay | Admitting: Physician Assistant

## 2014-08-14 VITALS — BP 124/77 | HR 80 | Temp 98.4°F | Resp 18 | Wt 139.0 lb

## 2014-08-14 DIAGNOSIS — G8929 Other chronic pain: Secondary | ICD-10-CM

## 2014-08-14 DIAGNOSIS — K759 Inflammatory liver disease, unspecified: Secondary | ICD-10-CM

## 2014-08-14 DIAGNOSIS — F329 Major depressive disorder, single episode, unspecified: Secondary | ICD-10-CM

## 2014-08-14 DIAGNOSIS — K7469 Other cirrhosis of liver: Secondary | ICD-10-CM

## 2014-08-14 DIAGNOSIS — M25511 Pain in right shoulder: Secondary | ICD-10-CM

## 2014-08-14 DIAGNOSIS — F32A Depression, unspecified: Secondary | ICD-10-CM

## 2014-08-14 DIAGNOSIS — F419 Anxiety disorder, unspecified: Secondary | ICD-10-CM

## 2014-08-14 MED ORDER — FENTANYL 25 MCG/HR TD PT72
25.0000 ug | MEDICATED_PATCH | TRANSDERMAL | Status: DC
Start: 2014-08-14 — End: 2014-08-14

## 2014-08-14 MED ORDER — FLUOXETINE HCL 20 MG PO TABS: 20.0000 mg | ORAL_TABLET | Freq: Every day | ORAL | Status: DC

## 2014-08-14 MED ORDER — FENTANYL 25 MCG/HR TD PT72: 25.0000 ug | MEDICATED_PATCH | TRANSDERMAL | Status: DC

## 2014-08-14 MED ORDER — OXYCODONE HCL 5 MG PO TABS: 5.0000 mg | ORAL_TABLET | Freq: Two times a day (BID) | ORAL | Status: DC

## 2014-08-14 MED ORDER — BUPROPION HCL ER (XL) 300 MG PO TB24: ORAL_TABLET | ORAL | Status: DC

## 2014-08-14 NOTE — Progress Notes (Signed)
Patient ID: Melissa [name] MRN: [id number], DOB: 1951-08-02, 64 y.o. Date of Encounter: 08/14/2014, 3:38 PM    Chief Complaint:  Chief Complaint  Patient presents with  . check up/med refills    discuss Wellbutrin     HPI: 64 y.o. year old white female is here for routine followup.  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  At prior OV with me she told me:  "she is very excited to let me know that she is now in a clinical trial at [facility]. She knows that she is receiving 2 medications which are on her medicine list as below. This is regarding her hepatitis. She says that her viral load is now down to 20. States that when she started the trial she was instructed that she could not start new medications or make any changes in medicines. Says she's not even allowed to take any type of vitamins or anything different than that whatever she was taking at the time of the start of the trial. Says that they do 14 vials of blood work every 2 weeks as well as EKGs etc."  At [place] 01/2014 she said that she had completed the clinical trial at [facility]. She is extremely excited that she "sustained nondetectable / cured hepatitis."  Says now they are even terming it as "Exposed"--Not that she "has" Hepatitis--says now can get life insurance and everything! Also says that she has found out that if she had paid for the treatment, she would have had to pay copay of  $ 86,000. 00  !!!!!  Because she was in the clinical trial, she received this for free.  Her pain is well-controlled on the oxycodone 5 mg twice a day and Fentanyl 25 mcg patches. She has a history of chronic shoulder pain. This is well documented in detail in prior notes of mine. She has undergone multiple surgeries and procedures and has been told that nothing more can be done surgically.  Her anxiety and depression have been well-controlled with the Wellbutrin at the current dose.  However, today 08/14/14 she says that the depression has  gotten worse and has gotten to where she has to do something about it.  She has tried to just deal with it and in other ways and not add further medication but says that his gotten to the point that she has to add further medication. Says that the depression symptoms are worse at different times of day. Late afternoon always seems to be bad. 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  No other complaints or concerns.  Home Meds:  Outpatient Prescriptions Prior to Visit  Medication Sig Dispense Refill  . buPROPion (WELLBUTRIN XL) 300 MG 24 hr tablet TAKE ONE (1) TABLET EACH DAY 30 tablet 5  . fentaNYL (DURAGESIC) 25 MCG/HR patch Place 1 patch (25 mcg total) onto the skin every 3 (three) days. 10 patch 0  . oxyCODONE (ROXICODONE) 5 MG immediate release tablet Take 1 tablet (5 mg total) by mouth 2 (two) times daily. 60 tablet 0  . ribavirin (REBETOL) 200 MG capsule Take 200 mg by mouth. 3 in the AM,  2 in the PM    . Sofosbuvir 400 MG TABS Take 1 tablet by mouth daily.     No facility-administered medications prior to visit.     Allergies: No Known Allergies    Review of Systems: See HPI for pertinent ROS. All  other ROS negative.    Physical Exam: Blood pressure 124/77, pulse 80, temperature 98.4 F (36.9 C), temperature source Oral, resp. rate 18, weight 139 lb (63.05 kg)., Body mass index is 22.45 kg/(m^2). General: WNWD WF. Appears in no acute distress. Neck: Supple. No thyromegaly. No lymphadenopathy. Lungs: Clear bilaterally to auscultation without wheezes, rales, or rhonchi. Breathing is unlabored. Heart: Regular rhythm. No murmurs, rubs, or gallops. Abdomen: Soft, non-tender, non-distended with normoactive bowel sounds. No hepatomegaly. No rebound/guarding. No obvious abdominal masses. Msk:  Strength and tone normal for age. Extremities/Skin: Warm and dry.  Neuro: Alert and oriented X 3. Moves all extremities  spontaneously. Gait is normal. CNII-XII grossly in tact. Psych:  Responds to questions appropriately with a normal affect.she is in good spirits and mood seems very stable and well controlled today.      ASSESSMENT AND PLAN:  64 y.o. year old female with    1. Anxiety - FLUoxetine (PROZAC) 20 MG tablet; Take 1 tablet (20 mg total) by mouth daily.  Dispense: 30 tablet; Refill: 2 - buPROPion (WELLBUTRIN XL) 300 MG 24 hr tablet; TAKE ONE (1) TABLET EACH DAY  Dispense: 30 tablet; Refill: 5  2. Depression - FLUoxetine (PROZAC) 20 MG tablet; Take 1 tablet (20 mg total) by mouth daily.  Dispense: 30 tablet; Refill: 2 - buPROPion (WELLBUTRIN XL) 300 MG 24 hr tablet; TAKE ONE (1) TABLET EACH DAY  Dispense: 30 tablet; Refill: 5   Will continue Wellbutrin the same. Will add Prozac. She says that she used Prozac many years ago and that from what she remembers it worked well for her and caused no adverse effects. Will have her come back for follow-up in 2 months. Follow-up sooner if she is having any adverse effects from the medication.   3. Chronic right shoulder pain - oxyCODONE (ROXICODONE) 5 MG immediate release tablet; Take 1 tablet (5 mg total) by mouth 2 (two) times daily.  Dispense: 60 tablet; Refill: 0 - oxyCODONE (ROXICODONE) 5 MG immediate release tablet; Take 1 tablet (5 mg total) by mouth 2 (two) times daily.  Dispense: 60 tablet; Refill: 0 - oxyCODONE (ROXICODONE) 5 MG immediate release tablet; Take 1 tablet (5 mg total) by mouth 2 (two) times daily.  Dispense: 60 tablet; Refill: 0 - fentaNYL (DURAGESIC) 25 MCG/HR patch; Place 1 patch (25 mcg total) onto the skin every 3 (three) days.  Dispense: 10 patch; Refill: 0 - fentaNYL (DURAGESIC) 25 MCG/HR patch; Place 1 patch (25 mcg total) onto the skin every 3 (three) days.  Dispense: 10 patch; Refill: 0 - fentaNYL (DURAGESIC) 25 MCG/HR patch; Place 1 patch (25 mcg total) onto the skin every 3 (three) days.  Dispense: 10 patch; Refill:  0   Patient states that she gets these pain medications at the [facility]. She says that the pharmacy will hold future prescriptions.  For each the oxycodone and fentanyl she has one prescription that says "Do not fill until 08/22/14"  She has one for "do not fill until 2/21"16".  She has another prescription that says "not fill until 10/21/14."    4. Hepatitis 5. Other cirrhosis of liver  6. she says that she sees a gynecologist and they have been managing her mammograms. Has been having mammogram each year.  7. DEXA:  At [place] 01/2014 I documented: "She had one of these at [facility] in 2007. She is interested in having a followup. She is agreeable to have this at the breast center where she has her mammogram.  I have scheduled follow up with [place] today." I reviewed this after she had left her visit 08/2014 I see that that DEXA was ordered but it does not look like she had this. I will follow-up with this and discuss it with her when she returns for office visit in 2 months.  8. screening colonoscopy: She says that she had this by Dr. Benson Norway in 2007 and it was normal and she was told to repeat 10 years.  9. Immunizations: Tetanus: She reports that she had this and about 2010 at which time she was bitten by cat.  10. She says that [facility] has done extensive labs including lipid panel etc. as well as multiple EKGs throughout the time of his clinical trial. She says that she will fax [place] a copy of those results.  11. Followup office visit 2 months or sooner if needed.  [address] [place], [place], [facility] 08/14/2014 3:38 PM

## 2014-10-14 ENCOUNTER — Ambulatory Visit: Payer: BLUE CROSS/BLUE SHIELD | Admitting: Physician Assistant

## 2014-10-17 ENCOUNTER — Ambulatory Visit: Payer: BLUE CROSS/BLUE SHIELD | Admitting: Physician Assistant

## 2014-11-07 ENCOUNTER — Encounter: Payer: Self-pay | Admitting: Physician Assistant

## 2014-11-07 ENCOUNTER — Ambulatory Visit (INDEPENDENT_AMBULATORY_CARE_PROVIDER_SITE_OTHER): Payer: BLUE CROSS/BLUE SHIELD | Admitting: Physician Assistant

## 2014-11-07 VITALS — BP 126/74 | HR 60 | Temp 98.3°F | Resp 18 | Ht 66.0 in | Wt 148.0 lb

## 2014-11-07 DIAGNOSIS — K7469 Other cirrhosis of liver: Secondary | ICD-10-CM | POA: Diagnosis not present

## 2014-11-07 DIAGNOSIS — G8929 Other chronic pain: Secondary | ICD-10-CM

## 2014-11-07 DIAGNOSIS — M25511 Pain in right shoulder: Secondary | ICD-10-CM

## 2014-11-07 DIAGNOSIS — F32A Depression, unspecified: Secondary | ICD-10-CM

## 2014-11-07 DIAGNOSIS — F419 Anxiety disorder, unspecified: Secondary | ICD-10-CM | POA: Diagnosis not present

## 2014-11-07 DIAGNOSIS — F329 Major depressive disorder, single episode, unspecified: Secondary | ICD-10-CM | POA: Diagnosis not present

## 2014-11-07 DIAGNOSIS — K759 Inflammatory liver disease, unspecified: Secondary | ICD-10-CM

## 2014-11-07 MED ORDER — OXYCODONE HCL 5 MG PO TABS: 5.0000 mg | ORAL_TABLET | Freq: Two times a day (BID) | ORAL | Status: DC

## 2014-11-07 MED ORDER — FENTANYL 25 MCG/HR TD PT72: 25.0000 ug | MEDICATED_PATCH | TRANSDERMAL | Status: DC

## 2014-11-07 MED ORDER — FLUOXETINE HCL 40 MG PO CAPS: 40.0000 mg | ORAL_CAPSULE | Freq: Every day | ORAL | Status: DC

## 2014-11-07 NOTE — Progress Notes (Signed)
Patient ID: GENIVA LOHNES MRN: [id number], DOB: 03-15-1951, 64 y.o. Date of Encounter: 11/07/2014, 9:09 AM    Chief Complaint:  Chief Complaint  Patient presents with  . 2 mth follow up    prozac     HPI: 64 y.o. year old white female is here for routine followup.  She works for a [employer], [employer] in [place].  At prior OV with me she told me:  "she is very excited to let me know that she is now in a clinical trial at [facility]. She knows that she is receiving 2 medications which are on her medicine list as below. This is regarding her hepatitis. She says that her viral load is now down to 20. States that when she started the trial she was instructed that she could not start new medications or make any changes in medicines. Says she's not even allowed to take any type of vitamins or anything different than that whatever she was taking at the time of the start of the trial. Says that they do 14 vials of blood work every 2 weeks as well as EKGs etc."  At [place] 01/2014 she said that she had completed the clinical trial at [facility]. She is extremely excited that she "sustained nondetectable / cured hepatitis."  Says now they are even terming it as "Exposed"--Not that she "has" Hepatitis--says now can get life insurance and everything! Also says that she has found out that if she had paid for the treatment, she would have had to pay copay of  $ 86,000. 00  !!!!!  Because she was in the clinical trial, she received this for free.  Her pain is well-controlled on the oxycodone 5 mg twice a day and Fentanyl 25 mcg patches. She has a history of chronic shoulder pain. This is well documented in detail in prior notes of mine. She has undergone multiple surgeries and procedures and has been told that nothing more can be done surgically.  In the past, her anxiety and depression had been well-controlled with the Wellbutrin at the current dose.   However, at her OV 08/14/2014-- she said that the depression  has gotten worse and has gotten to where she has to do something about it.  She had tried to just deal with it  in other ways and not add further medication, but said that it had gotten to the point that she had to add further medication. Said that the depression symptoms are worse at different times of day. Late afternoon always seems to be bad. 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  At that visit 08/14/14 continued her Wellbutrin. I added Prozac 20 mg.  At follow-up visit 11/07/14 she states that she may be noticing some slight improvement. Says that she still isn't feeling "good" but says that she may not be feeling quite as bad as she was. Has had no adverse effects with the Prozac.  At visit 11/07/14 she also reports that her doctor at [facility] recently did some further imaging test on her liver and his lab work. Labs included vitamin D which was low and so that Doctor has added vitamin D which is now on her medicine list.  No other complaints or concerns.  Home Meds:  Outpatient Prescriptions Prior to Visit  Medication Sig Dispense Refill  . buPROPion (WELLBUTRIN XL) 300 MG 24 hr tablet TAKE ONE (1) TABLET EACH DAY 30 tablet 5  .  fentaNYL (DURAGESIC) 25 MCG/HR patch Place 1 patch (25 mcg total) onto the skin every 3 (three) days. 10 patch 0  . FLUoxetine (PROZAC) 20 MG tablet Take 1 tablet (20 mg total) by mouth daily. 30 tablet 2  . oxyCODONE (ROXICODONE) 5 MG immediate release tablet Take 1 tablet (5 mg total) by mouth 2 (two) times daily. 60 tablet 0  . ribavirin (REBETOL) 200 MG capsule Take 200 mg by mouth. 3 in the AM,  2 in the PM    . Sofosbuvir 400 MG TABS Take 1 tablet by mouth daily.     No facility-administered medications prior to visit.     Allergies: No Known Allergies    Review of Systems: See HPI for pertinent ROS. All other ROS negative.    Physical Exam: Blood pressure 126/74, pulse 60, temperature  98.3 F (36.8 C), temperature source Oral, resp. rate 18, height 5\' 6"  (1.676 m), weight 148 lb (67.132 kg)., Body mass index is 23.9 kg/(m^2). General: WNWD WF. Appears in no acute distress. Neck: Supple. No thyromegaly. No lymphadenopathy. Lungs: Clear bilaterally to auscultation without wheezes, rales, or rhonchi. Breathing is unlabored. Heart: Regular rhythm. No murmurs, rubs, or gallops. Abdomen: Soft, non-tender, non-distended with normoactive bowel sounds. No hepatomegaly. No rebound/guarding. No obvious abdominal masses. Msk:  Strength and tone normal for age. Extremities/Skin: Warm and dry.  Neuro: Alert and oriented X 3. Moves all extremities spontaneously. Gait is normal. CNII-XII grossly in tact. Psych:  Responds to questions appropriately with a normal affect.she is in good spirits and mood seems very stable and well controlled today.      ASSESSMENT AND PLAN:  64 y.o. year old female with    1. Anxiety - FLUoxetine (PROZAC) 40 MG tablet; Take 1 tablet (40 mg total) by mouth daily.  Dispense: 30 tablet; Refill: 2 - buPROPion (WELLBUTRIN XL) 300 MG 24 hr tablet; TAKE ONE (1) TABLET EACH DAY  Dispense: 30 tablet; Refill: 5  2. Depression - FLUoxetine (PROZAC) 40 MG tablet; Take 1 tablet (40 mg total) by mouth daily.  Dispense: 30 tablet; Refill: 2 - buPROPion (WELLBUTRIN XL) 300 MG 24 hr tablet; TAKE ONE (1) TABLET EACH DAY  Dispense: 30 tablet; Refill: 5   Will continue Wellbutrin the same. Will increase Prozac to 40mg . Told her to follow-up with me in 2-3 months to let me know of her depression symptoms are improving.  At [place] 08/14/2014--She reported that she used Prozac many years ago and that from what she remembers it worked well for her and caused no adverse effects. Will have her come back for follow-up in 2 months. Follow-up sooner if she is having any adverse effects from the medication.   3. Chronic right shoulder pain - oxyCODONE (ROXICODONE) 5 MG immediate  release tablet; Take 1 tablet (5 mg total) by mouth 2 (two) times daily.  Dispense: 60 tablet; Refill: 0 - oxyCODONE (ROXICODONE) 5 MG immediate release tablet; Take 1 tablet (5 mg total) by mouth 2 (two) times daily.  Dispense: 60 tablet; Refill: 0 - oxyCODONE (ROXICODONE) 5 MG immediate release tablet; Take 1 tablet (5 mg total) by mouth 2 (two) times daily.  Dispense: 60 tablet; Refill: 0 - fentaNYL (DURAGESIC) 25 MCG/HR patch; Place 1 patch (25 mcg total) onto the skin every 3 (three) days.  Dispense: 10 patch; Refill: 0 - fentaNYL (DURAGESIC) 25 MCG/HR patch; Place 1 patch (25 mcg total) onto the skin every 3 (three) days.  Dispense: 10 patch; Refill: 0 - fentaNYL (  DURAGESIC) 25 MCG/HR patch; Place 1 patch (25 mcg total) onto the skin every 3 (three) days.  Dispense: 10 patch; Refill: 0   Patient states that she gets these pain medications at the [facility]. She says that the pharmacy will hold future prescriptions.  For each the oxycodone and fentanyl-- I printed 3 prescriptions of each medication.    one prescription  says "Do not fill until 11/21/14"   one for "do not fill until 5/21"16".  One prescription that says "not fill until 01/21/15."    4. Hepatitis 5. Other cirrhosis of liver  6. she says that she sees a gynecologist and they have been managing her mammograms. Has been having mammogram each year.  7. DEXA:  At [place] 01/2014 I documented: "She had one of these at [facility] in 2007. She is interested in having a followup. She is agreeable to have this at the breast center where she has her mammogram. I have scheduled follow up with [place] today." I reviewed this after she had left her visit 08/2014 I see that that DEXA was ordered but it does not look like she had this. I will follow-up with this and discuss it with her when she returns for office visit in 2 months. At [place] 11/07/2014--discussed scheduling DEXA scan. She says that she really would like to just hold off on  this. Says that she has had lots of doctors appointments recently. In addition she is wanting to have her cataract done soon. Will wait and rediscuss this in the future.  8. screening colonoscopy: She says that she had this by Dr. Benson Norway in 2007 and it was normal and she was told to repeat 10 years.  9. Immunizations: Tetanus: She reports that she had this and about 2010 at which time she was bitten by cat. Zostavax----I wrote on her AVS--and discussed with her--- she is to call her insurance to find out about cost and coverage. She is to then call us and let us know whether she defers this or whether she wants to receive Zostavax.  10. She says that [facility] has done extensive labs including lipid panel etc. as well as multiple EKGs throughout the time of his clinical trial. She says that she will fax [place] a copy of those results.  11. Followup office visit 2 months or sooner if needed.  Signed, [address] [place], [place], [facility] 11/07/2014 9:09 AM

## 2015-02-21 ENCOUNTER — Other Ambulatory Visit: Payer: Self-pay | Admitting: Physician Assistant

## 2015-02-21 DIAGNOSIS — F419 Anxiety disorder, unspecified: Secondary | ICD-10-CM

## 2015-02-21 DIAGNOSIS — M25511 Pain in right shoulder: Principal | ICD-10-CM

## 2015-02-21 DIAGNOSIS — G8929 Other chronic pain: Secondary | ICD-10-CM

## 2015-02-21 DIAGNOSIS — F32A Depression, unspecified: Secondary | ICD-10-CM

## 2015-02-21 DIAGNOSIS — F329 Major depressive disorder, single episode, unspecified: Secondary | ICD-10-CM

## 2015-02-21 NOTE — Telephone Encounter (Signed)
Patient needs refill on fentanyl and and oxycodone  [phone] when ready

## 2015-02-21 NOTE — Telephone Encounter (Signed)
LFR both 11/07/14  LOV 11/07/14  OK refill?

## 2015-02-24 ENCOUNTER — Telehealth: Payer: Self-pay | Admitting: Physician Assistant

## 2015-02-24 MED ORDER — OXYCODONE HCL 5 MG PO TABS: 5.0000 mg | ORAL_TABLET | Freq: Two times a day (BID) | ORAL | Status: DC

## 2015-02-24 MED ORDER — FLUOXETINE HCL 40 MG PO CAPS: 40.0000 mg | ORAL_CAPSULE | Freq: Every day | ORAL | Status: DC

## 2015-02-24 MED ORDER — FENTANYL 25 MCG/HR TD PT72: 25.0000 ug | MEDICATED_PATCH | TRANSDERMAL | Status: DC

## 2015-02-24 NOTE — Telephone Encounter (Signed)
Patient needs rx for her fentanyl  [phone]

## 2015-02-24 NOTE — Telephone Encounter (Signed)
Print 3 Rx for each of these meds. One for 7/25, 8/25, 9/25

## 2015-02-24 NOTE — Telephone Encounter (Signed)
Left pt message refills ready after 2PM today

## 2015-02-24 NOTE — Telephone Encounter (Signed)
Dupilcate request, has already been done

## 2015-05-13 ENCOUNTER — Telehealth: Payer: Self-pay | Admitting: Physician Assistant

## 2015-05-13 DIAGNOSIS — G8929 Other chronic pain: Secondary | ICD-10-CM

## 2015-05-13 DIAGNOSIS — M25511 Pain in right shoulder: Principal | ICD-10-CM

## 2015-05-13 NOTE — Telephone Encounter (Signed)
Patient has appointment on November the 7th with mb dixon, in the mean time would like to know if she can go ahead and get her prescriptions because she is going out of town  She would need her fentanyl and oxycodone if possible she is calling pharmacy about the rest of the meds  [phone]

## 2015-05-14 MED ORDER — OXYCODONE HCL 5 MG PO TABS: 5.0000 mg | ORAL_TABLET | Freq: Two times a day (BID) | ORAL | Status: DC

## 2015-05-14 MED ORDER — FENTANYL 25 MCG/HR TD PT72: 25.0000 ug | MEDICATED_PATCH | TRANSDERMAL | Status: DC

## 2015-05-14 NOTE — Telephone Encounter (Signed)
Left pt message that Rx's are ready.

## 2015-05-14 NOTE — Telephone Encounter (Signed)
Okay to go ahead and print 1 prescription of each of these.  The Fentanyl is for #10+0 and the oxycodone is #60+0

## 2015-06-09 ENCOUNTER — Ambulatory Visit: Payer: BLUE CROSS/BLUE SHIELD | Admitting: Physician Assistant

## 2015-06-19 ENCOUNTER — Ambulatory Visit: Payer: BLUE CROSS/BLUE SHIELD | Admitting: Physician Assistant

## 2015-06-23 ENCOUNTER — Ambulatory Visit: Payer: BLUE CROSS/BLUE SHIELD | Admitting: Physician Assistant

## 2015-07-03 ENCOUNTER — Encounter: Payer: Self-pay | Admitting: Physician Assistant

## 2015-07-03 ENCOUNTER — Ambulatory Visit (INDEPENDENT_AMBULATORY_CARE_PROVIDER_SITE_OTHER): Payer: BLUE CROSS/BLUE SHIELD | Admitting: Physician Assistant

## 2015-07-03 VITALS — BP 120/72 | HR 72 | Temp 98.4°F | Resp 18 | Wt 150.0 lb

## 2015-07-03 DIAGNOSIS — J209 Acute bronchitis, unspecified: Secondary | ICD-10-CM | POA: Diagnosis not present

## 2015-07-03 DIAGNOSIS — G8929 Other chronic pain: Secondary | ICD-10-CM

## 2015-07-03 DIAGNOSIS — F329 Major depressive disorder, single episode, unspecified: Secondary | ICD-10-CM | POA: Diagnosis not present

## 2015-07-03 DIAGNOSIS — K759 Inflammatory liver disease, unspecified: Secondary | ICD-10-CM | POA: Diagnosis not present

## 2015-07-03 DIAGNOSIS — J988 Other specified respiratory disorders: Secondary | ICD-10-CM | POA: Diagnosis not present

## 2015-07-03 DIAGNOSIS — F32A Depression, unspecified: Secondary | ICD-10-CM

## 2015-07-03 DIAGNOSIS — B9689 Other specified bacterial agents as the cause of diseases classified elsewhere: Secondary | ICD-10-CM

## 2015-07-03 DIAGNOSIS — M25511 Pain in right shoulder: Secondary | ICD-10-CM | POA: Diagnosis not present

## 2015-07-03 DIAGNOSIS — K7469 Other cirrhosis of liver: Secondary | ICD-10-CM | POA: Diagnosis not present

## 2015-07-03 DIAGNOSIS — F419 Anxiety disorder, unspecified: Secondary | ICD-10-CM

## 2015-07-03 MED ORDER — FENTANYL 25 MCG/HR TD PT72: 25.0000 ug | MEDICATED_PATCH | TRANSDERMAL | Status: DC

## 2015-07-03 MED ORDER — LEVOFLOXACIN 750 MG PO TABS: 750.0000 mg | ORAL_TABLET | Freq: Every day | ORAL | Status: DC

## 2015-07-03 MED ORDER — OXYCODONE HCL 5 MG PO TABS: 5.0000 mg | ORAL_TABLET | Freq: Two times a day (BID) | ORAL | Status: DC

## 2015-07-03 MED ORDER — BUPROPION HCL ER (XL) 300 MG PO TB24: ORAL_TABLET | ORAL | Status: DC

## 2015-07-03 MED ORDER — FLUOXETINE HCL 40 MG PO CAPS: 40.0000 mg | ORAL_CAPSULE | Freq: Every day | ORAL | Status: DC

## 2015-07-03 MED ORDER — PREDNISONE 20 MG PO TABS: ORAL_TABLET | ORAL | Status: DC

## 2015-07-03 NOTE — Progress Notes (Signed)
Patient ID: Melissa [name] MRN: [id number], DOB: [birth year], 64 y.o. Date of Encounter: 07/03/2015, 9:22 AM    Chief Complaint:  Chief Complaint  Patient presents with  . sick x 2 weeks    cough,congestion  took zpak of husbands no help  . Medication Refill     HPI: 64 y.o. year old white female is here for routine followup.  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  At prior OV with me she told me:  "she is very excited to let me know that she is now in a clinical trial at [facility] She knows that she is receiving 2 medications which are on her medicine list as below. This is regarding her hepatitis. She says that her viral load is now down to 20. States that when she started the trial she was instructed that she could not start new medications or make any changes in medicines. Says she's not even allowed to take any type of vitamins or anything different than that whatever she was taking at the time of the start of the trial. Says that they do 14 vials of blood work every 2 weeks as well as EKGs etc."  At [place] 01/2014 she said that she had completed the clinical trial at [facility]. She is extremely excited that she "sustained nondetectable / cured hepatitis."  Says now they are even terming it as "Exposed"--Not that she "has" Hepatitis--says now can get life insurance and everything! Also says that she has found out that if she had paid for the treatment, she would have had to pay copay of  $ 86,000. 00  !!!!!  Because she was in the clinical trial, she received this for free.  Her pain is well-controlled on the oxycodone 5 mg twice a day and Fentanyl 25 mcg patches. She has a history of chronic shoulder pain. This is well documented in detail in prior notes of mine. She has undergone multiple surgeries and procedures and has been told that nothing more can be done surgically.  In the past, her anxiety and depression had been well-controlled with the Wellbutrin at the current dose.    However, at her OV 62/13/2016-- she said that the depression has gotten worse and has gotten to where she has to do something about it.  She had tried to just deal with it  in other ways and not add further medication, but said that it had gotten to the point that she had to add further medication. Said that the depression symptoms are worse at different times of day. Late afternoon always seems to be bad. 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  At that visit 08/14/14 continued her Wellbutrin. I added Prozac 20 mg.  At follow-up visit 11/07/14 she states that she may be noticing some slight improvement. Says that she still isn't feeling "good" but says that she may not be feeling quite as bad as she was. Has had no adverse effects with the Prozac.  At visit 11/07/14 she also reports that her doctor at [facility] recently did some further imaging test on her liver and his lab work. Labs included vitamin D which was low and so that Doctor has added vitamin D which is now on her medicine list.  At [place] 07/03/2015: She reports that she has been sick for greater than 2 weeks. Her husband had a Z-Pak that he had not used since she started  that last Saturday and finished it yesterday. However has not improved since taking that. Last night still had a lot of cough. In general still has a lot of chest congestion and cough. That she really has not had any congestion or mucus in her head and nose. No significant sore throat. No fevers or chills. Says that she just saw her doctor at [facility] yesterday regarding her liver and they did lots of lab work. Says that her current pain medications continue to work well at controlling her pain and that this is stable and controlled. States that the Prozac at 40 mg is working well and she feels that her anxiety and depression are well controlled with current medications (Prozac and Wellbutrin combination).  No other  complaints or concerns.  Home Meds:  Outpatient Prescriptions Prior to Visit  Medication Sig Dispense Refill  . buPROPion (WELLBUTRIN XL) 300 MG 24 hr tablet TAKE ONE (1) TABLET EACH DAY 30 tablet 5  . fentaNYL (DURAGESIC) 25 MCG/HR patch Place 1 patch (25 mcg total) onto the skin every 3 (three) days. 10 patch 0  . FLUoxetine (PROZAC) 40 MG capsule Take 1 capsule (40 mg total) by mouth daily. 90 capsule 1  . oxyCODONE (ROXICODONE) 5 MG immediate release tablet Take 1 tablet (5 mg total) by mouth 2 (two) times daily. 60 tablet 0  . ribavirin (REBETOL) 200 MG capsule Take 200 mg by mouth. 3 in the AM,  2 in the PM    . Sofosbuvir 400 MG TABS Take 1 tablet by mouth daily.    . Vitamin D, Ergocalciferol, (DRISDOL) 50000 UNITS CAPS capsule Take by mouth.     No facility-administered medications prior to visit.     Allergies: No Known Allergies    Review of Systems: See HPI for pertinent ROS. All other ROS negative.    Physical Exam: Blood pressure 120/72, pulse 72, temperature 98.4 F (36.9 C), temperature source Oral, resp. rate 18, weight 150 lb (68.04 kg)., Body mass index is 24.22 kg/(m^2). General: WNWD WF. Appears in no acute distress. HEENT: Ears are normal bilaterally. Posterior pharynx and tonsils are normal with no erythema no exudate no peritonsillar abscess. Neck: Supple. No thyromegaly. No lymphadenopathy. Lungs:  She has wheezes throughout on the right side. Some wheezes at the left base. There is good air movement. Heart: Regular rhythm. No murmurs, rubs, or gallops. Abdomen: Soft, non-tender, non-distended with normoactive bowel sounds. No hepatomegaly. No rebound/guarding. No obvious abdominal masses. Msk:  Strength and tone normal for age. Extremities/Skin: Warm and dry.  Neuro: Alert and oriented X 3. Moves all extremities spontaneously. Gait is normal. CNII-XII grossly in tact. Psych:  Responds to questions appropriately with a normal affect.she is in good spirits  and mood seems very stable and well controlled today.      ASSESSMENT AND PLAN:  64 y.o. year old female with   Acute bronchitis, unspecified organism She is to take this antibiotic and prednisone as directed. Told her to definitely follow-up with [place] if symptoms do not resolve at completion of these. If that is the case, will need to get x-ray to further evaluate. - levofloxacin (LEVAQUIN) 750 MG tablet; Take 1 tablet (750 mg total) by mouth daily.  Dispense: 7 tablet; Refill: 0 - predniSONE (DELTASONE) 20 MG tablet; Take 2 daily.  Dispense: 10 tablet; Refill: 0   Bacterial lower respiratory infection She is to take this antibiotic and prednisone as directed. Told her to definitely follow-up with [place] if symptoms do  not resolve at completion of these. If that is the case, will need to get x-ray to further evaluate. - levofloxacin (LEVAQUIN) 750 MG tablet; Take 1 tablet (750 mg total) by mouth daily.  Dispense: 7 tablet; Refill: 0 - predniSONE (DELTASONE) 20 MG tablet; Take 2 daily.  Dispense: 10 tablet; Refill: 0    Anxiety Her anxiety and depression are well-controlled on current doses of Prozac and Wellbutrin.  Continue current medications at this time.   Depression Her anxiety and depression are well-controlled on current doses of Prozac and Wellbutrin.  Continue current medications at this time.    Chronic right shoulder pain Her shoulder pain is controlled on current medications. Continue current medications for pain control. Patient states that she gets these pain medications at the [facility]. She says that the pharmacy will hold future prescriptions.  For each the oxycodone and fentanyl-- I printed 3 prescriptions of each medication.    one prescription  says "Do not fill until 08/03/2015"   one for "do not fill until 09/03/2015".    - oxyCODONE (ROXICODONE) 5 MG immediate release tablet; Take 1 tablet (5 mg total) by mouth 2 (two) times daily.  Dispense: 60 tablet;  Refill: 0 - oxyCODONE (ROXICODONE) 5 MG immediate release tablet; Take 1 tablet (5 mg total) by mouth 2 (two) times daily.  Dispense: 60 tablet; Refill: 0 - oxyCODONE (ROXICODONE) 5 MG immediate release tablet; Take 1 tablet (5 mg total) by mouth 2 (two) times daily.  Dispense: 60 tablet; Refill: 0 - fentaNYL (DURAGESIC) 25 MCG/HR patch; Place 1 patch (25 mcg total) onto the skin every 3 (three) days.  Dispense: 10 patch; Refill: 0 - fentaNYL (DURAGESIC) 25 MCG/HR patch; Place 1 patch (25 mcg total) onto the skin every 3 (three) days.  Dispense: 10 patch; Refill: 0 - fentaNYL (DURAGESIC) 25 MCG/HR patch; Place 1 patch (25 mcg total) onto the skin every 3 (three) days.  Dispense: 10 patch; Refill: 0     4. Hepatitis This is managed by liver specialist at [facility] 5. Other cirrhosis of liver This is managed by liver specialist at [facility]  6. she says that she sees a gynecologist and they have been managing her mammograms. Has been having mammogram each year.  7. DEXA:  At [place] 01/2014 I documented: "She had one of these at [facility] in 2007. She is interested in having a followup. She is agreeable to have this at the breast center where she has her mammogram. I have scheduled follow up with [place] today." I reviewed this after she had left her visit 08/2014 I see that that DEXA was ordered but it does not look like she had this. I will follow-up with this and discuss it with her when she returns for office visit in 2 months. At [place] 11/07/2014--discussed scheduling DEXA scan. She says that she really would like to just hold off on this. Says that she has had lots of doctors appointments recently. In addition she is wanting to have her cataract done soon. Will wait and rediscuss this in the future.  8. screening colonoscopy: She says that she had this by Dr. Benson Norway in 2007 and it was normal and she was told to repeat 10 years.  9. Immunizations: Influenza vaccine----she states that she does not get any further  vaccines and she absolutely has to and is aware of risk versus benefits but does not want to get a flu vaccine Tetanus: She reports that she had this and about 2010  at which time she was bitten by cat. Zostavax----she states that she does not get any further vaccines and she absolutely has to and is aware of risk versus benefits but does not want to get a Shingles vaccine   10. She says that [facility] has done extensive labs including lipid panel etc. as well as multiple EKGs throughout the time of his clinical trial. She says that she will fax [place] a copy of those results.  11. Followup office visit 6 months or sooner if needed.  Signed, [address] [place], [place], [facility] 07/03/2015 9:22 AM

## 2015-10-03 ENCOUNTER — Other Ambulatory Visit: Payer: Self-pay | Admitting: Physician Assistant

## 2015-10-03 DIAGNOSIS — G8929 Other chronic pain: Secondary | ICD-10-CM

## 2015-10-03 DIAGNOSIS — M25511 Pain in right shoulder: Principal | ICD-10-CM

## 2015-10-03 NOTE — Telephone Encounter (Signed)
Last fill date 09/03/15.  LOV 07/03/15  OK refill?

## 2015-10-03 NOTE — Telephone Encounter (Signed)
Pt needs a refill of Fentanyl 25 mg patches and Oxycodone 5 mg 60. [id number]

## 2015-10-03 NOTE — Telephone Encounter (Signed)
Approved. Print Rxes. I will sign Monday--if pt can wait until Monday 8 a.m.

## 2015-10-06 MED ORDER — OXYCODONE HCL 5 MG PO TABS: 5.0000 mg | ORAL_TABLET | Freq: Two times a day (BID) | ORAL | Status: DC

## 2015-10-06 MED ORDER — FENTANYL 25 MCG/HR TD PT72: 25.0000 ug | MEDICATED_PATCH | TRANSDERMAL | Status: DC

## 2015-10-06 NOTE — Telephone Encounter (Signed)
Rx's ready and pt aware

## 2015-11-19 ENCOUNTER — Encounter: Payer: Self-pay | Admitting: Physician Assistant

## 2015-11-19 ENCOUNTER — Ambulatory Visit (INDEPENDENT_AMBULATORY_CARE_PROVIDER_SITE_OTHER): Payer: BLUE CROSS/BLUE SHIELD | Admitting: Physician Assistant

## 2015-11-19 VITALS — BP 130/90 | HR 64 | Temp 97.9°F | Resp 18 | Wt 160.0 lb

## 2015-11-19 DIAGNOSIS — K7469 Other cirrhosis of liver: Secondary | ICD-10-CM | POA: Diagnosis not present

## 2015-11-19 DIAGNOSIS — K759 Inflammatory liver disease, unspecified: Secondary | ICD-10-CM

## 2015-11-19 DIAGNOSIS — G8929 Other chronic pain: Secondary | ICD-10-CM | POA: Diagnosis not present

## 2015-11-19 DIAGNOSIS — F329 Major depressive disorder, single episode, unspecified: Secondary | ICD-10-CM | POA: Diagnosis not present

## 2015-11-19 DIAGNOSIS — M25511 Pain in right shoulder: Secondary | ICD-10-CM | POA: Diagnosis not present

## 2015-11-19 DIAGNOSIS — F419 Anxiety disorder, unspecified: Secondary | ICD-10-CM | POA: Diagnosis not present

## 2015-11-19 DIAGNOSIS — M25562 Pain in left knee: Secondary | ICD-10-CM

## 2015-11-19 DIAGNOSIS — F32A Depression, unspecified: Secondary | ICD-10-CM

## 2015-11-19 DIAGNOSIS — M25561 Pain in right knee: Secondary | ICD-10-CM | POA: Diagnosis not present

## 2015-11-19 MED ORDER — FENTANYL 25 MCG/HR TD PT72: 25.0000 ug | MEDICATED_PATCH | TRANSDERMAL | Status: DC

## 2015-11-19 MED ORDER — OXYCODONE HCL 5 MG PO TABS: 5.0000 mg | ORAL_TABLET | Freq: Two times a day (BID) | ORAL | Status: DC

## 2015-11-19 NOTE — Progress Notes (Addendum)
Patient ID: Melissa [name] MRN: [id number], DOB: Feb 05, 1951, 65 y.o. Date of Encounter: 11/19/2015, 4:24 PM    Chief Complaint:  Chief Complaint  Patient presents with  . c/o bilat knee swelling    never both at same time     HPI: 65 y.o. year old white female is here for routine followup.  She works for a [employer] in [place].  At prior OV with me she told me:  "she is very excited to let me know that she is now in a clinical trial at [facility]. She knows that she is receiving 2 medications which are on her medicine list as below. This is regarding her hepatitis. She says that her viral load is now down to 20. States that when she started the trial she was instructed that she could not start new medications or make any changes in medicines. Says she's not even allowed to take any type of vitamins or anything different than that whatever she was taking at the time of the start of the trial. Says that they do 14 vials of blood work every 2 weeks as well as EKGs etc."  At [place] 01/2014 she said that she had completed the clinical trial at [facility]. She is extremely excited that she "sustained nondetectable / cured hepatitis."  Says now they are even terming it as "Exposed"--Not that she "has" Hepatitis--says now can get life insurance and everything! Also says that she has found out that if she had paid for the treatment, she would have had to pay copay of  $ 86,000. 00  !!!!!  Because she was in the clinical trial, she received this for free.  Her pain is well-controlled on the oxycodone 5 mg twice a day and Fentanyl 25 mcg patches. She has a history of chronic shoulder pain. This is well documented in detail in prior notes of mine. She has undergone multiple surgeries and procedures and has been told that nothing more can be done surgically.  In the past, her anxiety and depression had been well-controlled with the Wellbutrin at the current dose.   However, at her OV 08/14/2014-- she  said that the depression has gotten worse and has gotten to where she has to do something about it.  She had tried to just deal with it  in other ways and not add further medication, but said that it had gotten to the point that she had to add further medication. Said that the depression symptoms are worse at different times of day. Late afternoon always seems to be bad. 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  At that visit 08/14/14 continued her Wellbutrin. I added Prozac 20 mg.  At follow-up visit 11/07/14 she states that she may be noticing some slight improvement. Says that she still isn't feeling "good" but says that she may not be feeling quite as bad as she was. Has had no adverse effects with the Prozac.  At visit 11/07/14 she also reports that her doctor at [facility] recently did some further imaging test on her liver and his lab work. Labs included vitamin D which was low and so that Doctor has added vitamin D which is now on her medicine list.  At [place] 07/03/2015: She reports that she has been sick for greater than 2 weeks. Her husband had a Z-Pak that he had not used since she started that last Saturday and finished it yesterday.  However has not improved since taking that. Last night still had a lot of cough. In general still has a lot of chest congestion and cough. That she really has not had any congestion or mucus in her head and nose. No significant sore throat. No fevers or chills. Says that she just saw her doctor at [facility] yesterday regarding her liver and they did lots of lab work. Says that her current pain medications continue to work well at controlling her pain and that this is stable and controlled. States that the Prozac at 40 mg and Wellbutrin combination are working well and she feels that her anxiety and depression are well controlled with current medications (Prozac and Wellbutrin combination).   OV 11/19/2015: She  reports that for several months now she has been having problems with her knees. Says that for example just a couple of days ago her right knee was swollen and achy. Now the left knee is swollen and achy. Says that it is never both knees at the same time. Says it keeps alternating left and right. Says that when she stoops down to put something in a cabinet at work, this causes severe pain and she has to find something to hold to and pull herself up.  States that she has had no problems with any other joints.(Other than chronic pain in shoulder). Has had no pain or swelling in either of her ankles either of her hips either of her wrist either of her elbows. She works at a [employer] and is wanting to check for Lyme disease. Has had no acute trauma or injury.  No other complaints or concerns. Does want to go ahead and pick up future prescriptions for her pain meds while she is here. You that last set was given on 10/06/15 43/6, 4/6, 5/6. Her pain is stable and controlled with these medicines. Her anxiety and depression are controlled on current doses of Prozac and Wellbutrin.  ADDENDUM ADDED 03/10/2016: Received OV note from Dr. Estanislado Pandy dated 03/03/2016: She obtained labs including CBC sedimentation rate CK CCP and HLA-B27 ACE level hepatitis panel TB Gold SPEP immunoglobulins magnesium serum ferritin and TIBC. As well bilateral knee joints were aspirated-- synovial fluid was aspirated and it was injected with Kenalog and lidocaine. She plans to see her back for follow-up visit after lab results returned.   Home Meds:  Outpatient Prescriptions Prior to Visit  Medication Sig Dispense Refill  . buPROPion (WELLBUTRIN XL) 300 MG 24 hr tablet TAKE ONE (1) TABLET EACH DAY 90 tablet 1  . FLUoxetine (PROZAC) 40 MG capsule Take 1 capsule (40 mg total) by mouth daily. 90 capsule 1  . fentaNYL (DURAGESIC) 25 MCG/HR patch Place 1 patch (25 mcg total) onto the skin every 3 (three) days. 10 patch 0  .  oxyCODONE (ROXICODONE) 5 MG immediate release tablet Take 1 tablet (5 mg total) by mouth 2 (two) times daily. 60 tablet 0  . levofloxacin (LEVAQUIN) 750 MG tablet Take 1 tablet (750 mg total) by mouth daily. 7 tablet 0  . predniSONE (DELTASONE) 20 MG tablet Take 2 daily. 10 tablet 0  . ribavirin (REBETOL) 200 MG capsule Take 200 mg by mouth. Reported on 11/19/2015    . Sofosbuvir 400 MG TABS Take 1 tablet by mouth daily. Reported on 11/19/2015    . Vitamin D, Ergocalciferol, (DRISDOL) 50000 UNITS CAPS capsule Take by mouth. Reported on 11/19/2015     No facility-administered medications prior to visit.     Allergies: No  Known Allergies    Review of Systems: See HPI for pertinent ROS. All other ROS negative.    Physical Exam: Blood pressure 130/90, pulse 64, temperature 97.9 F (36.6 C), temperature source Oral, resp. rate 18, weight 160 lb (72.576 kg)., Body mass index is 25.84 kg/(m^2). General: WNWD WF. Appears in no acute distress. Neck: Supple. No thyromegaly. No lymphadenopathy. Lungs:  Clear. Heart: Regular rhythm. No murmurs, rubs, or gallops. Abdomen: Soft, non-tender, non-distended with normoactive bowel sounds. No hepatomegaly. No rebound/guarding. No obvious abdominal masses. Msk:  Strength and tone normal for age. Bilateral Knees: Diffuse swelling. No area of tenderness with palpation. No erythema. No warmth. Extremities/Skin: Warm and dry.  Neuro: Alert and oriented X 3. Moves all extremities spontaneously. Gait is normal. CNII-XII grossly in tact. Psych:  Responds to questions appropriately with a normal affect.she is in good spirits and mood seems very stable and well controlled today.      ASSESSMENT AND PLAN:  65 y.o. year old female with    Left knee pain Will check labs. Will obtain x-rays. Will follow-up with her when I get these results. Will go ahead and put in referral for her to follow-up with Dr. Estanislado Pandy. If it turns out that I get a diagnosis that I  can treat that we can cancel that referral. Otherwise will go ahead and be obtaining referral. - B. burgdorfi antibodies by WB - Rocky mtn spotted fvr abs pnl(IgG+IgM) - Sedimentation rate - Uric acid - ANA - Rheumatoid factor - CBC with Differential/Platelet - COMPLETE METABOLIC PANEL WITH GFR - TSH - DG Knee Complete 4 Views Left; Future - DG Knee Complete 4 Views Right; Future - Ambulatory referral to Rheumatology  ADDENDUM ADDED 03/10/2016: Received OV note from Dr. Estanislado Pandy dated 03/03/2016: She obtained labs including CBC sedimentation rate CK CCP and HLA-B27 ACE level hepatitis panel TB Gold SPEP immunoglobulins magnesium serum ferritin and TIBC. As well bilateral knee joints were aspirated-- synovial fluid was aspirated and it was injected with Kenalog and lidocaine. She plans to see her back for follow-up visit after lab results returned.  Right knee pain Will check labs. Will obtain x-rays. Will follow-up with her when I get these results. Will go ahead and put in referral for her to follow-up with Dr. Estanislado Pandy. If it turns out that I get a diagnosis that I can treat that we can cancel that referral. Otherwise will go ahead and be obtaining referral.  - B. burgdorfi antibodies by WB - Rocky mtn spotted fvr abs pnl(IgG+IgM) - Sedimentation rate - Uric acid - ANA - Rheumatoid factor - CBC with Differential/Platelet - COMPLETE METABOLIC PANEL WITH GFR - TSH - DG Knee Complete 4 Views Left; Future - DG Knee Complete 4 Views Right; Future - Ambulatory referral to Rheumatology ADDENDUM ADDED 03/10/2016: Received OV note from Dr. Estanislado Pandy dated 03/03/2016: She obtained labs including CBC sedimentation rate CK CCP and HLA-B27 ACE level hepatitis panel TB Gold SPEP immunoglobulins magnesium serum ferritin and TIBC. As well bilateral knee joints were aspirated-- synovial fluid was aspirated and it was injected with Kenalog and lidocaine. She plans to see her back for follow-up  visit after lab results returned.   Anxiety Her anxiety and depression are well-controlled on current doses of Prozac and Wellbutrin.  Continue current medications at this time.   Depression Her anxiety and depression are well-controlled on current doses of Prozac and Wellbutrin.  Continue current medications at this time.    Chronic right shoulder pain Her  shoulder pain is controlled on current medications. Continue current medications for pain control. Patient states that she gets these pain medications at the [facility]. She says that the pharmacy will hold future prescriptions. Last set of prescriptions for each of these was printed on 3/643/6, 4/6, 5/6.  For each the oxycodone and fentanyl-- I printed 2 prescriptions of each medication.    one prescription  says "Do not fill until 01/06/2016"   one for "do not fill until 02/05/2016".    - oxyCODONE (ROXICODONE) 5 MG immediate release tablet; Take 1 tablet (5 mg total) by mouth 2 (two) times daily.  Dispense: 60 tablet; Refill: 0 - oxyCODONE (ROXICODONE) 5 MG immediate release tablet; Take 1 tablet (5 mg total) by mouth 2 (two) times daily.  Dispense: 60 tablet; Refill: 0 - fentaNYL (DURAGESIC) 25 MCG/HR patch; Place 1 patch (25 mcg total) onto the skin every 3 (three) days.  Dispense: 10 patch; Refill: 0 - fentaNYL (DURAGESIC) 25 MCG/HR patch; Place 1 patch (25 mcg total) onto the skin every 3 (three) days.  Dispense: 10 patch; Refill: 0   4. Hepatitis This is managed by liver specialist at [facility]  5. Other cirrhosis of liver This is managed by liver specialist at [facility]  6. she says that she sees a gynecologist and they have been managing her mammograms. Has been having mammogram each year.  7. DEXA:  At [place] 01/2014 I documented: "She had one of these at [facility] in 2007. She is interested in having a followup. She is agreeable to have this at the breast center where she has her mammogram. I have scheduled follow up with [place]  today." I reviewed this after she had left her visit 08/2014 I see that that DEXA was ordered but it does not look like she had this. I will follow-up with this and discuss it with her when she returns for office visit in 2 months. At [place] 11/07/2014--discussed scheduling DEXA scan. She says that she really would like to just hold off on this. Says that she has had lots of doctors appointments recently. In addition she is wanting to have her cataract done soon. Will wait and rediscuss this in the future.  8. screening colonoscopy: She says that she had this by Dr. Benson Norway in 2007 and it was normal and she was told to repeat 10 years.  9. Immunizations: Influenza vaccine----she states that she does not get any further vaccines and she absolutely has to and is aware of risk versus benefits but does not want to get a flu vaccine Tetanus: She reports that she had this and about 2010 at which time she was bitten by cat. Zostavax----she states that she does not get any further vaccines and she absolutely has to and is aware of risk versus benefits but does not want to get a Shingles vaccine   10. She says that [facility] has done extensive labs including lipid panel etc. as well as multiple EKGs throughout the time of his clinical trial. She says that she will fax [place] a copy of those results.  11. Followup office visit 6 months or sooner if needed.  Marin Olp [place], [place], [facility] 11/19/2015 4:24 PM

## 2015-11-20 ENCOUNTER — Ambulatory Visit (HOSPITAL_COMMUNITY)
Admission: RE | Admit: 2015-11-20 | Discharge: 2015-11-20 | Disposition: A | Discharge status: Home / Self Care | Payer: BLUE CROSS/BLUE SHIELD | Source: Ambulatory Visit | Attending: Physician Assistant | Admitting: Physician Assistant

## 2015-11-20 DIAGNOSIS — M25562 Pain in left knee: Secondary | ICD-10-CM

## 2015-11-20 DIAGNOSIS — M25461 Effusion, right knee: Secondary | ICD-10-CM | POA: Diagnosis not present

## 2015-11-20 DIAGNOSIS — M25561 Pain in right knee: Secondary | ICD-10-CM | POA: Insufficient documentation

## 2015-11-20 DIAGNOSIS — M25462 Effusion, left knee: Secondary | ICD-10-CM | POA: Diagnosis not present

## 2015-11-20 LAB — COMPLETE METABOLIC PANEL WITH GFR
ALBUMIN: 4.1 g/dL (ref 3.6–5.1)
ALK PHOS: 56 U/L (ref 33–130)
ALT: 12 U/L (ref 6–29)
AST: 17 U/L (ref 10–35)
BILIRUBIN TOTAL: 0.6 mg/dL (ref 0.2–1.2)
BUN: 13 mg/dL (ref 7–25)
CHLORIDE: 102 mmol/L (ref 98–110)
CO2: 27 mmol/L (ref 20–31)
CREATININE: 0.5 mg/dL (ref 0.50–0.99)
Calcium: 8.5 mg/dL — ABNORMAL LOW (ref 8.6–10.4)
GFR, Est Non African American: >89 mL/min (ref >=60)
Glucose, Bld: 94 mg/dL (ref 70–99)
Potassium: 4 mmol/L (ref 3.5–5.3)
Sodium: 139 mmol/L (ref 135–146)
Total Protein: 6.5 g/dL (ref 6.1–8.1)

## 2015-11-20 LAB — CBC WITH DIFFERENTIAL/PLATELET
BASOS PCT: 0 %
Basophils Absolute: 0 cells/uL (ref 0–200)
EOS ABS: 245 {cells}/uL (ref 15–500)
Eosinophils Relative: 7 %
HEMATOCRIT: 38.6 % (ref 35.0–45.0)
HEMOGLOBIN: 13.2 g/dL (ref 12.0–15.0)
LYMPHS ABS: 1155 {cells}/uL (ref 850–3900)
LYMPHS PCT: 33 %
MCH: 31.1 pg (ref 27.0–33.0)
MCHC: 34.2 g/dL (ref 32.0–36.0)
MCV: 90.8 fL (ref 80.0–100.0)
MONO ABS: 210 {cells}/uL (ref 200–950)
MPV: 11 fL (ref 7.5–12.5)
Monocytes Relative: 6 %
NEUTROS PCT: 54 %
Neutro Abs: 1890 cells/uL (ref 1500–7800)
Platelets: 90 10*3/uL — ABNORMAL LOW (ref 140–400)
RBC: 4.25 MIL/uL (ref 3.80–5.10)
RDW: 14.2 % (ref 11.0–15.0)
WBC: 3.5 10*3/uL — ABNORMAL LOW (ref 3.8–10.8)

## 2015-11-20 LAB — LYME ABY, WSTRN BLT IGG & IGM W/BANDS
B BURGDORFERI IGG ABS (IB): NEGATIVE
B BURGDORFERI IGM ABS (IB): NEGATIVE
LYME DISEASE 28 KD IGG: NONREACTIVE
LYME DISEASE 30 KD IGG: NONREACTIVE
LYME DISEASE 39 KD IGG: NONREACTIVE
LYME DISEASE 45 KD IGG: NONREACTIVE
LYME DISEASE 58 KD IGG: NONREACTIVE
LYME DISEASE 93 KD IGG: NONREACTIVE
Lyme Disease 18 kD IgG: NONREACTIVE
Lyme Disease 23 kD IgG: NONREACTIVE
Lyme Disease 23 kD IgM: NONREACTIVE
Lyme Disease 39 kD IgM: NONREACTIVE
Lyme Disease 41 kD IgG: NONREACTIVE
Lyme Disease 41 kD IgM: NONREACTIVE
Lyme Disease 66 kD IgG: NONREACTIVE

## 2015-11-20 LAB — TSH: TSH: 2.1 m[IU]/L

## 2015-11-20 LAB — URIC ACID: URIC ACID, SERUM: 4.3 mg/dL (ref 2.4–7.0)

## 2015-11-20 LAB — ANA: Anti Nuclear Antibody(ANA): NEGATIVE

## 2015-11-20 LAB — SEDIMENTATION RATE: Sed Rate: 4 mm/hr (ref 0–30)

## 2015-11-20 LAB — RHEUMATOID FACTOR: Rhuematoid fact SerPl-aCnc: <10 IU/mL (ref <=14)

## 2015-11-24 LAB — ROCKY MTN SPOTTED FVR ABS PNL(IGG+IGM)
RMSF IGG: NOT DETECTED
RMSF IgM: NOT DETECTED

## 2016-01-01 ENCOUNTER — Ambulatory Visit: Payer: BLUE CROSS/BLUE SHIELD | Admitting: Physician Assistant

## 2016-03-11 ENCOUNTER — Encounter: Payer: Self-pay | Admitting: Physician Assistant

## 2016-04-28 ENCOUNTER — Other Ambulatory Visit: Payer: Self-pay

## 2016-04-28 DIAGNOSIS — M25511 Pain in right shoulder: Principal | ICD-10-CM

## 2016-04-28 DIAGNOSIS — G8929 Other chronic pain: Secondary | ICD-10-CM

## 2016-04-28 NOTE — Telephone Encounter (Signed)
Reviewed---At OV 11/19/2015---Printed 3 sets of Rxes---the additional 2 sets were: "Do not fill until 01/06/16" And "Do not fill until 02/05/2016"  Ok to print another set of 3 Rx for each med for same quantity as before.  Print one with todays date.  On next set--Add Comment--"Do not fill until 05/28/2016" On next set--Add Comment---"Do not fill until 06/28/2016"

## 2016-04-28 NOTE — Telephone Encounter (Signed)
Last OV 11-19-15 Last refill 11-19-15 Ok to refill?

## 2016-04-29 ENCOUNTER — Other Ambulatory Visit: Payer: Self-pay

## 2016-04-29 DIAGNOSIS — M25511 Pain in right shoulder: Principal | ICD-10-CM

## 2016-04-29 DIAGNOSIS — G8929 Other chronic pain: Secondary | ICD-10-CM

## 2016-04-29 MED ORDER — FENTANYL 25 MCG/HR TD PT72: 25.0000 ug | MEDICATED_PATCH | TRANSDERMAL | 0 refills | Status: DC

## 2016-04-29 MED ORDER — OXYCODONE HCL 5 MG PO TABS: 5.0000 mg | ORAL_TABLET | Freq: Two times a day (BID) | ORAL | 0 refills | Status: DC

## 2016-04-29 NOTE — Telephone Encounter (Signed)
RX filled can be picked up after 2 pm on 04-29-16

## 2016-04-29 NOTE — Telephone Encounter (Signed)
RX Filled can be picked up after 2pm 04/29/16

## 2016-04-30 NOTE — Telephone Encounter (Signed)
Patient called back she is aware that she can pick up prescriptions.

## 2016-08-03 ENCOUNTER — Other Ambulatory Visit: Payer: Self-pay | Admitting: Physician Assistant

## 2016-08-03 DIAGNOSIS — F419 Anxiety disorder, unspecified: Secondary | ICD-10-CM

## 2016-08-03 DIAGNOSIS — F32A Depression, unspecified: Secondary | ICD-10-CM

## 2016-08-03 DIAGNOSIS — F329 Major depressive disorder, single episode, unspecified: Secondary | ICD-10-CM

## 2017-01-10 ENCOUNTER — Other Ambulatory Visit: Payer: Self-pay | Admitting: Family Medicine

## 2017-01-10 DIAGNOSIS — Z1231 Encounter for screening mammogram for malignant neoplasm of breast: Secondary | ICD-10-CM

## 2017-01-12 ENCOUNTER — Other Ambulatory Visit: Payer: Self-pay | Admitting: Family Medicine

## 2017-01-12 ENCOUNTER — Ambulatory Visit
Admission: RE | Admit: 2017-01-12 | Discharge: 2017-01-12 | Disposition: A | Discharge status: Home / Self Care | Payer: BLUE CROSS/BLUE SHIELD | Source: Ambulatory Visit | Attending: Family Medicine | Admitting: Family Medicine

## 2017-01-12 DIAGNOSIS — R52 Pain, unspecified: Secondary | ICD-10-CM

## 2017-01-12 DIAGNOSIS — R609 Edema, unspecified: Secondary | ICD-10-CM

## 2017-01-13 ENCOUNTER — Other Ambulatory Visit: Payer: Self-pay | Admitting: Family Medicine

## 2017-01-13 DIAGNOSIS — M25562 Pain in left knee: Secondary | ICD-10-CM

## 2017-01-13 DIAGNOSIS — M2540 Effusion, unspecified joint: Secondary | ICD-10-CM

## 2017-01-20 ENCOUNTER — Ambulatory Visit
Admission: RE | Admit: 2017-01-20 | Discharge: 2017-01-20 | Disposition: A | Discharge status: Home / Self Care | Payer: BLUE CROSS/BLUE SHIELD | Source: Ambulatory Visit | Attending: Family Medicine | Admitting: Family Medicine

## 2017-01-20 DIAGNOSIS — Z1231 Encounter for screening mammogram for malignant neoplasm of breast: Secondary | ICD-10-CM

## 2018-04-18 DIAGNOSIS — M25562 Pain in left knee: Secondary | ICD-10-CM | POA: Diagnosis not present

## 2018-04-18 DIAGNOSIS — M25512 Pain in left shoulder: Secondary | ICD-10-CM | POA: Diagnosis not present

## 2018-04-18 DIAGNOSIS — F419 Anxiety disorder, unspecified: Secondary | ICD-10-CM | POA: Diagnosis not present

## 2018-04-18 DIAGNOSIS — Z79891 Long term (current) use of opiate analgesic: Secondary | ICD-10-CM | POA: Diagnosis not present

## 2018-07-14 DIAGNOSIS — J0100 Acute maxillary sinusitis, unspecified: Secondary | ICD-10-CM | POA: Diagnosis not present

## 2018-07-14 DIAGNOSIS — J040 Acute laryngitis: Secondary | ICD-10-CM | POA: Diagnosis not present

## 2018-07-14 DIAGNOSIS — M25512 Pain in left shoulder: Secondary | ICD-10-CM | POA: Diagnosis not present

## 2018-07-14 DIAGNOSIS — M170 Bilateral primary osteoarthritis of knee: Secondary | ICD-10-CM | POA: Diagnosis not present

## 2018-08-14 DIAGNOSIS — M25561 Pain in right knee: Secondary | ICD-10-CM | POA: Diagnosis not present

## 2018-08-14 DIAGNOSIS — M25562 Pain in left knee: Secondary | ICD-10-CM | POA: Diagnosis not present

## 2018-08-14 DIAGNOSIS — M170 Bilateral primary osteoarthritis of knee: Secondary | ICD-10-CM | POA: Diagnosis not present

## 2018-08-14 DIAGNOSIS — Z79891 Long term (current) use of opiate analgesic: Secondary | ICD-10-CM | POA: Diagnosis not present

## 2018-09-11 DIAGNOSIS — M25512 Pain in left shoulder: Secondary | ICD-10-CM | POA: Diagnosis not present

## 2018-09-11 DIAGNOSIS — Z79891 Long term (current) use of opiate analgesic: Secondary | ICD-10-CM | POA: Diagnosis not present

## 2018-09-11 DIAGNOSIS — M25511 Pain in right shoulder: Secondary | ICD-10-CM | POA: Diagnosis not present

## 2018-09-20 DIAGNOSIS — J209 Acute bronchitis, unspecified: Secondary | ICD-10-CM | POA: Diagnosis not present

## 2018-11-10 DIAGNOSIS — M25511 Pain in right shoulder: Secondary | ICD-10-CM | POA: Diagnosis not present

## 2018-11-10 DIAGNOSIS — M1611 Unilateral primary osteoarthritis, right hip: Secondary | ICD-10-CM | POA: Diagnosis not present

## 2018-11-10 DIAGNOSIS — Z79891 Long term (current) use of opiate analgesic: Secondary | ICD-10-CM | POA: Diagnosis not present

## 2018-11-10 DIAGNOSIS — Z7189 Other specified counseling: Secondary | ICD-10-CM | POA: Diagnosis not present

## 2018-11-10 DIAGNOSIS — M25512 Pain in left shoulder: Secondary | ICD-10-CM | POA: Diagnosis not present

## 2018-11-10 DIAGNOSIS — M533 Sacrococcygeal disorders, not elsewhere classified: Secondary | ICD-10-CM | POA: Diagnosis not present

## 2019-01-09 DIAGNOSIS — M25511 Pain in right shoulder: Secondary | ICD-10-CM | POA: Diagnosis not present

## 2019-01-09 DIAGNOSIS — M25512 Pain in left shoulder: Secondary | ICD-10-CM | POA: Diagnosis not present

## 2019-01-09 DIAGNOSIS — F419 Anxiety disorder, unspecified: Secondary | ICD-10-CM | POA: Diagnosis not present

## 2019-01-09 DIAGNOSIS — Z79891 Long term (current) use of opiate analgesic: Secondary | ICD-10-CM | POA: Diagnosis not present

## 2019-04-06 DIAGNOSIS — M47812 Spondylosis without myelopathy or radiculopathy, cervical region: Secondary | ICD-10-CM | POA: Diagnosis not present

## 2019-04-06 DIAGNOSIS — F330 Major depressive disorder, recurrent, mild: Secondary | ICD-10-CM | POA: Diagnosis not present

## 2019-04-06 DIAGNOSIS — E559 Vitamin D deficiency, unspecified: Secondary | ICD-10-CM | POA: Diagnosis not present

## 2019-04-06 DIAGNOSIS — K7469 Other cirrhosis of liver: Secondary | ICD-10-CM | POA: Diagnosis not present

## 2019-04-11 DIAGNOSIS — M25511 Pain in right shoulder: Secondary | ICD-10-CM | POA: Diagnosis not present

## 2019-04-11 DIAGNOSIS — M25561 Pain in right knee: Secondary | ICD-10-CM | POA: Diagnosis not present

## 2019-04-11 DIAGNOSIS — M25512 Pain in left shoulder: Secondary | ICD-10-CM | POA: Diagnosis not present

## 2019-04-11 DIAGNOSIS — M25562 Pain in left knee: Secondary | ICD-10-CM | POA: Diagnosis not present

## 2019-04-18 ENCOUNTER — Telehealth: Payer: Self-pay

## 2019-04-18 NOTE — Telephone Encounter (Signed)
Called patient to get her scheduled for appointment patient answered the call but then disconnected the call I called back and not answer but left voice mail for patient to call the office back when she was ready to make appointment.

## 2019-04-20 ENCOUNTER — Ambulatory Visit: Payer: BLUE CROSS/BLUE SHIELD | Admitting: Cardiology

## 2019-04-24 NOTE — Progress Notes (Signed)
Cardiology Office Note:    Date:  04/26/2019   ID:  Melissa [name], DOB [birth year], MRN [id number]  PCP:  Leslie Andrea, MD  Cardiologist:  No primary care provider on file.  Electrophysiologist:  None   Referring MD: Orlena Sheldon, PA-C   Chief complaint: palpitations  History of Present Illness:    Melissa [name] is a 68 y.o. 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  She reports that it started a few months ago.  Wakes up and feels like heart is beating out of chest.  Always occurs at night.  Reports that she has checked pulse and has been elevated  .Denies any chest pain or dyspnea.  Occurs 2 times per week.  Lasts for up to 15 minutes.  Goes for walks up to 2 miles.  Denies any exertional symptoms.  No family history of heart disease.  Smoked up to 0.5 ppd x 15 years, quit 10 years ago.  Was told she had mitral valve prolapse in the past but does not think she has had an echo.  Past Medical History:  Diagnosis Date  . Anxiety   . Chronic shoulder pain    h/o 3 surgeries on right shoulder. h/o 1 surgery on left shoulder  . Cirrhosis [facility])    sees Dr Loletha Grayer @ [facility]. Gets MRI Q 6 mos  . Depression   . Hepatitis    sees Dr Loletha Grayer @ [facility]-has MRI Q 6 months    Past Surgical History:  Procedure Laterality Date  . ABDOMINAL HYSTERECTOMY  05/18/10   still has ovaries  . shoulder sugery      Current Medications: Current Meds  Medication Sig  . buPROPion (WELLBUTRIN XL) 300 MG 24 hr tablet TAKE ONE (1) TABLET EACH DAY  . FLUoxetine (PROZAC) 40 MG capsule TAKE ONE CAPSULE BY MOUTH DAILY  . oxyCODONE (ROXICODONE) 5 MG immediate release tablet Take 1 tablet (5 mg total) by mouth 2 (two) times daily.     Allergies:   Patient has no known allergies.   Social History   Socioeconomic History  . Marital status: Married    Spouse name: Not on file  . Number of children:  Not on file  . Years of education: Not on file  . Highest education level: Not on file  Occupational History  . Occupation: Works at [employer] in [place]  . Financial resource strain: Not on file  . Food insecurity    Worry: Not on file    Inability: Not on file  . Transportation needs    Medical: Not on file    Non-medical: Not on file  Tobacco Use  . Smoking status: Former Smoker    Types: Cigarettes    Quit date: 08/20/2009    Years since quitting: 9.6  . Smokeless tobacco: Never Used  Substance and Sexual Activity  . Alcohol use: No  . Drug use: No  . Sexual activity: Not on file  Lifestyle  . Physical activity    Days per week: Not on file    Minutes per session: Not on file  . Stress: Not on file  Relationships  . Social Herbalist on phone: Not on file    Gets together: Not on file    Attends religious service: Not on file    Active member of club or  organization: Not on file    Attends meetings of clubs or organizations: Not on file    Relationship status: Not on file  Other Topics Concern  . Not on file  Social History Narrative  . Not on file     Family History: No family history of heart disease  ROS:   Please see the history of present illness.     All other systems reviewed and are negative.  EKGs/Labs/Other Studies Reviewed:    The following studies were reviewed today:   EKG:  EKG isordered today.  The ekg ordered today demonstrates NSR, rate 79, QTc 447  Recent Labs: No results found for requested labs within last 8760 hours.  Recent Lipid Panel No results found for: CHOL, TRIG, HDL, CHOLHDL, VLDL, LDLCALC, LDLDIRECT  Physical Exam:    VS:  BP 110/80   Pulse 79   Ht 5\' 7"  (1.702 m)   Wt 148 lb 8 oz (67.4 kg)   BMI 23.26 kg/m     Wt Readings from Last 3 Encounters:  04/26/19 148 lb 8 oz (67.4 kg)  11/19/15 160 lb (72.6 kg)  07/03/15 150 lb (68 kg)     GEN: Well nourished, well developed in  no acute distress HEENT: Normal NECK: No JVD LYMPHATICS: No lymphadenopathy CARDIAC: RRR, no murmurs, rubs, gallops RESPIRATORY:  Clear to auscultation without rales, wheezing or rhonchi  ABDOMEN: Soft, non-tender, non-distended MUSCULOSKELETAL:  No edema; No deformity  SKIN: Warm and dry NEUROLOGIC:  Alert and oriented x 3 PSYCHIATRIC:  Normal affect   ASSESSMENT:    1. Palpitations   2. MVP (mitral valve prolapse)    PLAN:    In order of problems listed above:  Palpitations: occurs twice per week up to 15 minutes, feels like heart is racing - Zio patch x 1 week  Mitral valve prolapse: unclear history, was told she had MVP but does not think she had an echo done - TTE   Medication Adjustments/Labs and Tests Ordered: Current medicines are reviewed at length with the patient today.  Concerns regarding medicines are outlined above.  Orders Placed This Encounter  Procedures  . LONG TERM MONITOR (3-14 DAYS)  . ECHOCARDIOGRAM COMPLETE   No orders of the defined types were placed in this encounter.   There are no Patient Instructions on file for this visit.   Signed, Donato Heinz, MD  04/26/2019 8:35 AM    [facility]

## 2019-04-26 ENCOUNTER — Ambulatory Visit (INDEPENDENT_AMBULATORY_CARE_PROVIDER_SITE_OTHER): Payer: PPO | Admitting: Cardiology

## 2019-04-26 ENCOUNTER — Encounter: Payer: Self-pay | Admitting: Cardiology

## 2019-04-26 ENCOUNTER — Other Ambulatory Visit: Payer: Self-pay

## 2019-04-26 ENCOUNTER — Ambulatory Visit: Payer: BLUE CROSS/BLUE SHIELD | Admitting: Cardiology

## 2019-04-26 VITALS — BP 110/80 | HR 79 | Ht 67.0 in | Wt 148.5 lb

## 2019-04-26 DIAGNOSIS — I341 Nonrheumatic mitral (valve) prolapse: Secondary | ICD-10-CM | POA: Diagnosis not present

## 2019-04-26 DIAGNOSIS — R002 Palpitations: Secondary | ICD-10-CM | POA: Diagnosis not present

## 2019-04-26 NOTE — Addendum Note (Signed)
Addended by: Alvina Filbert B on: 04/26/2019 03:54 PM   Modules accepted: Orders

## 2019-04-26 NOTE — Patient Instructions (Signed)
Medication Instructions:  Continue same medications   Lab work: None ordered   Testing/Procedures: Schedule Echo Schedule 7 day Zio Monitor  Follow-Up: At [facility], you and your health needs are our priority.  As part of our continuing mission to provide you with exceptional heart care, we have created designated Provider Care Teams.  These Care Teams include your primary Cardiologist (physician) and Advanced Practice Providers (APPs -  Physician Assistants and Nurse Practitioners) who all work together to provide you with the care you need, when you need it. . Follow up appointment will be determined after test results

## 2019-05-02 ENCOUNTER — Telehealth: Payer: Self-pay

## 2019-05-02 NOTE — Telephone Encounter (Signed)
Spoke to pt, verified address. 7 day ZIO mailed to pt's home.

## 2019-05-03 ENCOUNTER — Ambulatory Visit (HOSPITAL_COMMUNITY): Payer: PPO | Attending: Cardiology

## 2019-05-03 ENCOUNTER — Other Ambulatory Visit: Payer: Self-pay

## 2019-05-03 DIAGNOSIS — R002 Palpitations: Secondary | ICD-10-CM | POA: Insufficient documentation

## 2019-05-03 DIAGNOSIS — I083 Combined rheumatic disorders of mitral, aortic and tricuspid valves: Secondary | ICD-10-CM | POA: Diagnosis not present

## 2019-05-05 ENCOUNTER — Ambulatory Visit (INDEPENDENT_AMBULATORY_CARE_PROVIDER_SITE_OTHER): Payer: PPO

## 2019-05-05 DIAGNOSIS — R002 Palpitations: Secondary | ICD-10-CM

## 2019-05-22 DIAGNOSIS — R002 Palpitations: Secondary | ICD-10-CM | POA: Diagnosis not present

## 2019-07-06 DIAGNOSIS — F330 Major depressive disorder, recurrent, mild: Secondary | ICD-10-CM | POA: Diagnosis not present

## 2019-07-06 DIAGNOSIS — M25512 Pain in left shoulder: Secondary | ICD-10-CM | POA: Diagnosis not present

## 2019-07-06 DIAGNOSIS — M25562 Pain in left knee: Secondary | ICD-10-CM | POA: Diagnosis not present

## 2019-07-06 DIAGNOSIS — F419 Anxiety disorder, unspecified: Secondary | ICD-10-CM | POA: Diagnosis not present

## 2019-07-28 ENCOUNTER — Emergency Department (HOSPITAL_COMMUNITY)
Admission: EM | Admit: 2019-07-28 | Discharge: 2019-07-29 | Disposition: A | Discharge status: Home / Self Care | Payer: PPO | Attending: Emergency Medicine | Admitting: Emergency Medicine

## 2019-07-28 ENCOUNTER — Encounter (HOSPITAL_COMMUNITY): Payer: Self-pay

## 2019-07-28 ENCOUNTER — Other Ambulatory Visit: Payer: Self-pay

## 2019-07-28 ENCOUNTER — Emergency Department (HOSPITAL_COMMUNITY): Payer: PPO

## 2019-07-28 DIAGNOSIS — W01198A Fall on same level from slipping, tripping and stumbling with subsequent striking against other object, initial encounter: Secondary | ICD-10-CM | POA: Insufficient documentation

## 2019-07-28 DIAGNOSIS — Y92480 Sidewalk as the place of occurrence of the external cause: Secondary | ICD-10-CM | POA: Insufficient documentation

## 2019-07-28 DIAGNOSIS — S199XXA Unspecified injury of neck, initial encounter: Secondary | ICD-10-CM | POA: Diagnosis not present

## 2019-07-28 DIAGNOSIS — S01511A Laceration without foreign body of lip, initial encounter: Secondary | ICD-10-CM | POA: Diagnosis not present

## 2019-07-28 DIAGNOSIS — S0083XA Contusion of other part of head, initial encounter: Secondary | ICD-10-CM

## 2019-07-28 DIAGNOSIS — Y999 Unspecified external cause status: Secondary | ICD-10-CM | POA: Insufficient documentation

## 2019-07-28 DIAGNOSIS — R609 Edema, unspecified: Secondary | ICD-10-CM | POA: Diagnosis not present

## 2019-07-28 DIAGNOSIS — Z79899 Other long term (current) drug therapy: Secondary | ICD-10-CM | POA: Insufficient documentation

## 2019-07-28 DIAGNOSIS — Y939 Activity, unspecified: Secondary | ICD-10-CM | POA: Insufficient documentation

## 2019-07-28 DIAGNOSIS — Z87891 Personal history of nicotine dependence: Secondary | ICD-10-CM | POA: Diagnosis not present

## 2019-07-28 DIAGNOSIS — M542 Cervicalgia: Secondary | ICD-10-CM | POA: Diagnosis not present

## 2019-07-28 DIAGNOSIS — S0181XA Laceration without foreign body of other part of head, initial encounter: Secondary | ICD-10-CM | POA: Diagnosis not present

## 2019-07-28 DIAGNOSIS — S0990XA Unspecified injury of head, initial encounter: Secondary | ICD-10-CM | POA: Diagnosis not present

## 2019-07-28 DIAGNOSIS — W19XXXA Unspecified fall, initial encounter: Secondary | ICD-10-CM

## 2019-07-28 DIAGNOSIS — I10 Essential (primary) hypertension: Secondary | ICD-10-CM | POA: Diagnosis not present

## 2019-07-28 MED ORDER — OXYCODONE-ACETAMINOPHEN 5-325 MG PO TABS
1.0000 | ORAL_TABLET | Freq: Once | ORAL | Status: AC
  Administered 2019-07-28: 1 via ORAL
  Filled 2019-07-28: qty 1

## 2019-07-28 MED ORDER — LIDOCAINE-EPINEPHRINE (PF) 2 %-1:200000 IJ SOLN
10.0000 mL | Freq: Once | INTRAMUSCULAR | Status: AC
  Administered 2019-07-28: 10 mL
  Filled 2019-07-28: qty 20

## 2019-07-28 MED ORDER — ONDANSETRON 4 MG PO TBDP
4.0000 mg | ORAL_TABLET | Freq: Once | ORAL | Status: AC
  Administered 2019-07-28: 19:00:00 4 mg via ORAL
  Filled 2019-07-28: qty 1

## 2019-07-28 MED ORDER — AMOXICILLIN 500 MG PO CAPS: 500.0000 mg | ORAL_CAPSULE | Freq: Three times a day (TID) | ORAL | 0 refills | Status: DC

## 2019-07-28 NOTE — ED Notes (Signed)
Patient verbalizes understanding of discharge instructions. Opportunity for questioning and answers were provided. Armband removed by staff, pt discharged from ED w/ husband

## 2019-07-28 NOTE — ED Provider Notes (Signed)
Medical screening examination/treatment/procedure(s) were conducted as a shared visit with non-physician practitioner(s) and myself.  I personally evaluated the patient during the encounter. Briefly, the patient is a 68 y.o. female with mechanical fall.  Laceration to the upper lip.  Hematoma over the left side of her face.  Neuro intact.  No bony tenderness.  CT scan of her head, neck, face unremarkable.  Laceration to be repaired by my PA.  Overall unremarkable vitals.  Mechanical fall.  Patient not on blood thinners.  Likely back contusion.  Teeth are intact.  Will discharge on antibiotics.  Given return precautions.   EKG Interpretation None           Lennice Sites, DO 07/28/19 1932

## 2019-07-28 NOTE — ED Triage Notes (Signed)
Pt BIB [facility] for eval of large facial lac s/p fall. Pt reports mechanical fall in parking lot, tripped and struck her face. Pt denies blood thinners. Pt reports head pain. GCS 15, no other trauma

## 2019-07-28 NOTE — Discharge Instructions (Addendum)
Today you received medications that may make you sleepy or impair your ability to make decisions.  For the next 24 hours please do not drive, operate heavy machinery, care for a small child with out another adult present, or perform any activities that may cause harm to you or someone else if you were to fall asleep or be impaired.   You may have diarrhea from the antibiotics.  It is very important that you continue to take the antibiotics even if you get diarrhea unless a medical professional tells you that you may stop taking them.  If you stop too early the bacteria you are being treated for will become stronger and you may need different, more powerful antibiotics that have more side effects and worsening diarrhea.  Please stay well hydrated and consider probiotics as they may decrease the severity of your diarrhea.  Please be aware that if you take any hormonal contraception (birth control pills, nexplanon, the ring, etc) that your birth control will not work while you are taking antibiotics and you need to use back up protection as directed on the birth control medication information insert.

## 2019-07-28 NOTE — ED Provider Notes (Signed)
[facility] EMERGENCY DEPARTMENT Provider Note   CSN: [id number] Arrival date & time: 07/28/19  1806     History Chief Complaint  Patient presents with  . Fall  . Facial Injury    CLARE HOUDESHELL is a 68 y.o. female with a past medical history of cirrhosis, hepatitis, anxiety, depression, chronic shoulder pain, who presents today for evaluation of facial pain after a fall.  She reports that she was walking out of work when she tripped over a stump hitting the left side of her face on the sidewalk.  Questionable LOC.  She denies any blood thinners.  She denies any pain in her mid and lower back, chest, abdomen pelvis and bilateral upper and lower extremities.  She reports pain primarily in her face and neck.  She states that she has a cut on her left sided lip.  She states that she is up-to-date on her tetanus vaccine as she works in a [employer], [employer].  She reports that she chipped one of her teeth, however the others feel like they are intact.    HPI     Past Medical History:  Diagnosis Date  . Anxiety   . Chronic shoulder pain    h/o 3 surgeries on right shoulder. h/o 1 surgery on left shoulder  . Cirrhosis [facility])    sees Dr Loletha Grayer @ [place]. Gets MRI Q 6 mos  . Depression   . Hepatitis    sees Dr Loletha Grayer @ [facility]-has MRI Q 6 months    Patient Active Problem List   Diagnosis Date Noted  . Palpitations 04/26/2019  . Chronic right shoulder pain 02/19/2013  . Hepatitis 02/19/2013  . Cirrhosis ([place]) 02/19/2013  . Anxiety   . Depression     Past Surgical History:  Procedure Laterality Date  . ABDOMINAL HYSTERECTOMY  05/18/10   still has ovaries  . shoulder sugery       OB History   No obstetric history on file.     History reviewed. No pertinent family history.  Social History   Tobacco Use  . Smoking status: Former Smoker    Types: Cigarettes    Quit date: 08/20/2009    Years since quitting: 9.9  . Smokeless tobacco: Never Used  Substance Use Topics    . Alcohol use: No  . Drug use: No    Home Medications Prior to Admission medications   Medication Sig Start Date End Date Taking? Authorizing Provider  amoxicillin (AMOXIL) 500 MG capsule Take 1 capsule (500 mg total) by mouth 3 (three) times daily. 07/28/19   Lorin Glass, PA-C  buPROPion (WELLBUTRIN XL) 300 MG 24 hr tablet TAKE ONE (1) TABLET EACH DAY 08/03/16   Dena Billet B, PA-C  fentaNYL (DURAGESIC) 25 MCG/HR patch Place 1 patch (25 mcg total) onto the skin every 3 (three) days. Patient not taking: Reported on 04/26/2019 04/29/16   Dena Billet B, PA-C  FLUoxetine (PROZAC) 40 MG capsule TAKE ONE CAPSULE BY MOUTH DAILY 08/03/16   Dena Billet B, PA-C  oxyCODONE (ROXICODONE) 5 MG immediate release tablet Take 1 tablet (5 mg total) by mouth 2 (two) times daily. 04/29/16   Orlena Sheldon, PA-C    Allergies    Patient has no known allergies.  Review of Systems   Review of Systems  Constitutional: Negative for chills and fever.  HENT: Positive for dental problem and facial swelling. Negative for drooling, sinus pressure and sinus pain.   Eyes: Negative for visual disturbance.  Respiratory: Negative for chest tightness and shortness of breath.   Cardiovascular: Negative for chest pain.  Gastrointestinal: Negative for abdominal pain, nausea and vomiting.  Genitourinary: Negative for dysuria.  Musculoskeletal: Positive for neck pain. Negative for back pain.  Skin: Positive for wound.  Neurological: Positive for syncope (Questionable after she tripped.  ) and headaches. Negative for speech difficulty, weakness and numbness.  Psychiatric/Behavioral: Negative for confusion.  All other systems reviewed and are negative.   Physical Exam Updated Vital Signs Ht 5\' 5"  (1.651 m)   Wt 68 kg   SpO2 98%   BMI 24.95 kg/m   Physical Exam Vitals and nursing note reviewed.  Constitutional:      General: She is not in acute distress.    Appearance: She is well-developed. She is not  diaphoretic.  HENT:     Head: Normocephalic.     Comments: No raccoon's eyes or battle signs.  There is a large area of edema over the left maxilla and left sided forehead.  There is a superficial abrasion present over the left lateral brow ridge. There is a laceration full-thickness through the left-sided lip near the labial commissure.  Laceration does not cross the vermilion border, rather appears to run parallel.  Laceration is approximately 1.5 cm externally and 1.5 cm on the internal mucosa, that are connected, making for an approximate total of 3 cm U-shaped laceration.    Nose: Nose normal.     Mouth/Throat:     Comments: Maxillary central incisor has small chip.  Eyes:     General: No scleral icterus.       Right eye: No discharge.        Left eye: No discharge.     Extraocular Movements: Extraocular movements intact.     Conjunctiva/sclera: Conjunctivae normal.     Comments: Full, pain-free range of motion without evidence of entrapment or nystagmus.  Neck:     Comments: C-collar initially in place.  After CT scans resulted was removed patient has full range of motion without localized midline tenderness to palpation. Cardiovascular:     Rate and Rhythm: Normal rate and regular rhythm.     Pulses: Normal pulses.     Heart sounds: Normal heart sounds.  Pulmonary:     Effort: Pulmonary effort is normal. No respiratory distress.     Breath sounds: No stridor.  Abdominal:     General: There is no distension.  Musculoskeletal:        General: No deformity.     Comments: No midline T/L-spine tenderness to palpation, step-offs, or deformities.  Skin:    General: Skin is warm and dry.  Neurological:     Mental Status: She is alert.     Motor: No abnormal muscle tone.     Comments: Patient is awake and alert.  Speech is not slurred.  She is able to provide appropriate history without difficulties.  5/5 grip strength bilaterally.  She is able to lift bilateral legs off the bed  without difficulty.    Psychiatric:        Behavior: Behavior normal.     ED Results / Procedures / Treatments   Labs (all labs ordered are listed, but only abnormal results are displayed) Labs Reviewed - No data to display  EKG None  Radiology CT Head Wo Contrast  Result Date: 07/28/2019 CLINICAL DATA:  Fall today with facial lacerations. EXAM: CT HEAD WITHOUT CONTRAST CT MAXILLOFACIAL WITHOUT CONTRAST CT CERVICAL SPINE WITHOUT CONTRAST TECHNIQUE: Multidetector  CT imaging of the head, cervical spine, and maxillofacial structures were performed using the standard protocol without intravenous contrast. Multiplanar CT image reconstructions of the cervical spine and maxillofacial structures were also generated. COMPARISON:  MRI cervical spine 10/14/2008. FINDINGS: CT HEAD FINDINGS Brain: There is no evidence of acute intracranial hemorrhage, mass lesion, brain edema or extra-axial fluid collection. The ventricles and subarachnoid spaces are appropriately sized for age. There is no CT evidence of acute cortical infarction. There is asymmetric subcortical low-density in the left frontoparietal subcortical white matter which is likely due to chronic small vessel ischemic changes. Vascular: Intracranial vascular calcifications. No hyperdense vessel identified. Skull: Negative for fracture or focal lesion. Other: None. CT MAXILLOFACIAL FINDINGS Osseous: No evidence of acute maxillofacial fracture. The mandible and temporomandibular joints are intact. Orbits: Bilateral lens surgery. The globes are intact. The optic nerves and extraocular muscles appear intact. No orbital hematoma or foreign body. Sinuses: The paranasal sinuses, mastoid air cells and middle ears are clear without air-fluid levels. Soft tissues: There is periorbital soft tissue swelling on the left with inferior extension over the left maxilla. There is an associated small subcutaneous hematoma. No evidence of soft tissue emphysema or foreign  body. CT CERVICAL SPINE FINDINGS Alignment: Stable. There is a chronic mild anterolisthesis at the C4-5 and C5-6 levels. Skull base and vertebrae: No evidence of acute cervical spine fracture or traumatic subluxation. The right C3-4 facet joint is chronically ankylosed. There is possible partial ankylosis of the contralateral left facet joint. Soft tissues and spinal canal: No prevertebral fluid or swelling. No visible canal hematoma. Disc levels: Multilevel spondylosis with disc space narrowing, uncinate spurring and facet hypertrophy. The disc space narrowing is most advanced at C6-7 and has progressed from the previous MRI. No large disc herniation identified. Multilevel mild-to-moderate osseous foraminal narrowing. Upper chest: Mild scarring at both lung apices. No acute findings. Other: None. IMPRESSION: 1. No acute intracranial findings. Probable asymmetric chronic small vessel ischemic changes in left frontal parietal subcortical white matter. 2. Left periorbital soft tissue injury. No evidence of orbital hematoma, globe injury or foreign body. 3. No evidence of acute maxillofacial fracture. 4. No evidence of acute cervical spine fracture, traumatic subluxation or static signs of instability. 5. Progressive cervical spondylosis compared with previous MRI. Electronically Signed   By: Richardean Sale M.D.   On: 07/28/2019 18:57   CT Cervical Spine Wo Contrast  Result Date: 07/28/2019 CLINICAL DATA:  Fall today with facial lacerations. EXAM: CT HEAD WITHOUT CONTRAST CT MAXILLOFACIAL WITHOUT CONTRAST CT CERVICAL SPINE WITHOUT CONTRAST TECHNIQUE: Multidetector CT imaging of the head, cervical spine, and maxillofacial structures were performed using the standard protocol without intravenous contrast. Multiplanar CT image reconstructions of the cervical spine and maxillofacial structures were also generated. COMPARISON:  MRI cervical spine 10/14/2008. FINDINGS: CT HEAD FINDINGS Brain: There is no evidence of  acute intracranial hemorrhage, mass lesion, brain edema or extra-axial fluid collection. The ventricles and subarachnoid spaces are appropriately sized for age. There is no CT evidence of acute cortical infarction. There is asymmetric subcortical low-density in the left frontoparietal subcortical white matter which is likely due to chronic small vessel ischemic changes. Vascular: Intracranial vascular calcifications. No hyperdense vessel identified. Skull: Negative for fracture or focal lesion. Other: None. CT MAXILLOFACIAL FINDINGS Osseous: No evidence of acute maxillofacial fracture. The mandible and temporomandibular joints are intact. Orbits: Bilateral lens surgery. The globes are intact. The optic nerves and extraocular muscles appear intact. No orbital hematoma or foreign body. Sinuses: The paranasal  sinuses, mastoid air cells and middle ears are clear without air-fluid levels. Soft tissues: There is periorbital soft tissue swelling on the left with inferior extension over the left maxilla. There is an associated small subcutaneous hematoma. No evidence of soft tissue emphysema or foreign body. CT CERVICAL SPINE FINDINGS Alignment: Stable. There is a chronic mild anterolisthesis at the C4-5 and C5-6 levels. Skull base and vertebrae: No evidence of acute cervical spine fracture or traumatic subluxation. The right C3-4 facet joint is chronically ankylosed. There is possible partial ankylosis of the contralateral left facet joint. Soft tissues and spinal canal: No prevertebral fluid or swelling. No visible canal hematoma. Disc levels: Multilevel spondylosis with disc space narrowing, uncinate spurring and facet hypertrophy. The disc space narrowing is most advanced at C6-7 and has progressed from the previous MRI. No large disc herniation identified. Multilevel mild-to-moderate osseous foraminal narrowing. Upper chest: Mild scarring at both lung apices. No acute findings. Other: None. IMPRESSION: 1. No acute  intracranial findings. Probable asymmetric chronic small vessel ischemic changes in left frontal parietal subcortical white matter. 2. Left periorbital soft tissue injury. No evidence of orbital hematoma, globe injury or foreign body. 3. No evidence of acute maxillofacial fracture. 4. No evidence of acute cervical spine fracture, traumatic subluxation or static signs of instability. 5. Progressive cervical spondylosis compared with previous MRI. Electronically Signed   By: Richardean Sale M.D.   On: 07/28/2019 18:57   CT Maxillofacial Wo Contrast  Result Date: 07/28/2019 CLINICAL DATA:  Fall today with facial lacerations. EXAM: CT HEAD WITHOUT CONTRAST CT MAXILLOFACIAL WITHOUT CONTRAST CT CERVICAL SPINE WITHOUT CONTRAST TECHNIQUE: Multidetector CT imaging of the head, cervical spine, and maxillofacial structures were performed using the standard protocol without intravenous contrast. Multiplanar CT image reconstructions of the cervical spine and maxillofacial structures were also generated. COMPARISON:  MRI cervical spine 10/14/2008. FINDINGS: CT HEAD FINDINGS Brain: There is no evidence of acute intracranial hemorrhage, mass lesion, brain edema or extra-axial fluid collection. The ventricles and subarachnoid spaces are appropriately sized for age. There is no CT evidence of acute cortical infarction. There is asymmetric subcortical low-density in the left frontoparietal subcortical white matter which is likely due to chronic small vessel ischemic changes. Vascular: Intracranial vascular calcifications. No hyperdense vessel identified. Skull: Negative for fracture or focal lesion. Other: None. CT MAXILLOFACIAL FINDINGS Osseous: No evidence of acute maxillofacial fracture. The mandible and temporomandibular joints are intact. Orbits: Bilateral lens surgery. The globes are intact. The optic nerves and extraocular muscles appear intact. No orbital hematoma or foreign body. Sinuses: The paranasal sinuses, mastoid  air cells and middle ears are clear without air-fluid levels. Soft tissues: There is periorbital soft tissue swelling on the left with inferior extension over the left maxilla. There is an associated small subcutaneous hematoma. No evidence of soft tissue emphysema or foreign body. CT CERVICAL SPINE FINDINGS Alignment: Stable. There is a chronic mild anterolisthesis at the C4-5 and C5-6 levels. Skull base and vertebrae: No evidence of acute cervical spine fracture or traumatic subluxation. The right C3-4 facet joint is chronically ankylosed. There is possible partial ankylosis of the contralateral left facet joint. Soft tissues and spinal canal: No prevertebral fluid or swelling. No visible canal hematoma. Disc levels: Multilevel spondylosis with disc space narrowing, uncinate spurring and facet hypertrophy. The disc space narrowing is most advanced at C6-7 and has progressed from the previous MRI. No large disc herniation identified. Multilevel mild-to-moderate osseous foraminal narrowing. Upper chest: Mild scarring at both lung apices. No acute findings.  Other: None. IMPRESSION: 1. No acute intracranial findings. Probable asymmetric chronic small vessel ischemic changes in left frontal parietal subcortical white matter. 2. Left periorbital soft tissue injury. No evidence of orbital hematoma, globe injury or foreign body. 3. No evidence of acute maxillofacial fracture. 4. No evidence of acute cervical spine fracture, traumatic subluxation or static signs of instability. 5. Progressive cervical spondylosis compared with previous MRI. Electronically Signed   By: Richardean Sale M.D.   On: 07/28/2019 18:57    Procedures .Marland KitchenLaceration Repair  Date/Time: 07/28/2019 8:32 PM Performed by: Lorin Glass, PA-C Authorized by: Lorin Glass, PA-C   Consent:    Consent obtained:  Verbal   Consent given by:  Patient   Risks discussed:  Infection, need for additional repair, poor cosmetic result, pain,  retained foreign body, tendon damage, vascular damage, poor wound healing and nerve damage   Alternatives discussed:  No treatment and referral (Alternative wound closures) Anesthesia (see MAR for exact dosages):    Anesthesia method:  Local infiltration   Local anesthetic:  Lidocaine 2% WITH epi Laceration details:    Location:  Lip   Lip location:  Upper lip, full thickness   Vermilion border involved: no     Length (cm):  3 Repair type:    Repair type:  Intermediate Pre-procedure details:    Preparation:  Imaging obtained to evaluate for foreign bodies and patient was prepped and draped in usual sterile fashion Exploration:    Hemostasis achieved with:  Direct pressure   Wound exploration: wound explored through full range of motion and entire depth of wound probed and visualized     Wound extent: no foreign bodies/material noted and no underlying fracture noted     Contaminated: yes   Treatment:    Area cleansed with:  Saline Skin repair:    Repair method:  Sutures   Suture size: 6-0 Chromic Gut used externally, and 4-0 chromic gut used internally.   Suture material:  Chromic gut   Suture technique:  Simple interrupted   Number of sutures:  6 Approximation:    Approximation:  Close   Vermilion border: well-aligned   Post-procedure details:    Dressing:  Open (no dressing)   Patient tolerance of procedure:  Tolerated well, no immediate complications   (including critical care time)  Medications Ordered in ED Medications  lidocaine-EPINEPHrine (XYLOCAINE W/EPI) 2 %-1:200000 (PF) injection 10 mL (10 mLs Infiltration Given 07/28/19 1859)  oxyCODONE-acetaminophen (PERCOCET/ROXICET) 5-325 MG per tablet 1 tablet (1 tablet Oral Given 07/28/19 1859)  ondansetron (ZOFRAN-ODT) disintegrating tablet 4 mg (4 mg Oral Given 07/28/19 1858)    ED Course  I have reviewed the triage vital signs and the nursing notes.  Pertinent labs & imaging results that were available during my care  of the patient were reviewed by me and considered in my medical decision making (see chart for details).    MDM Rules/Calculators/A&P                     Patient presents today for evaluation after a mechanical fall.  There is a question if she may have had a syncopal event when she hit her head on the pavement however she reportedly clearly tripped over a stump while walking today.  On exam she is awake and alert.  She has significant swelling over the left-sided forehead and maxilla.  EOMs full and intact without vision changes.  C-collar was in place on arrival.  CT head, face, and  neck were obtained without evidence of fracture or other significant osseous injury.  She denied any other injuries.  She has a total of 3 cm U-shaped laceration on the left-sided upper lip.  She reports she is up-to-date on her tetanus.  Laceration was repaired, please see procedure note.  Patient will be placed on prophylactic antibiotics.  [facility] PMP is consulted and it appears patient has active narcotic prescriptions, therefore additional narcotic pain medications will not be prescribed, however her pain was treated in the emergency room with Percocet, and she was given Zofran to reduce risk of vomiting with a intraoral injury.  Dissolvable sutures were placed.  Discussed with patient that they should dissolve on their own, however also discussed appropriate timing of removal if she so desires.  Recommended a soft diet.  We discussed appropriate care, along with instructions on reducing strain on the sutures and the possibility of wound reopening.  Return precautions were discussed with patient who states their understanding.  At the time of discharge patient denied any unaddressed complaints or concerns.  Patient is agreeable for discharge home.  Note: Portions of this report may have been transcribed using voice recognition software. Every effort was made to ensure accuracy; however, inadvertent  computerized transcription errors may be present   Final Clinical Impression(s) / ED Diagnoses Final diagnoses:  Contusion of face, initial encounter  Fall, initial encounter  Lip laceration, initial encounter    Rx / DC Orders ED Discharge Orders         Ordered    amoxicillin (AMOXIL) 500 MG capsule  3 times daily     07/28/19 2000           Lorin Glass, [place] 07/29/19 0039    Lennice Sites, DO 07/29/19 0117    Lennice Sites, DO 07/29/19 [id number]

## 2019-07-28 NOTE — ED Notes (Signed)
Husband would want an update as soon you can [phone] Layla Barter

## 2019-10-05 DIAGNOSIS — Z79891 Long term (current) use of opiate analgesic: Secondary | ICD-10-CM | POA: Diagnosis not present

## 2019-10-05 DIAGNOSIS — M25512 Pain in left shoulder: Secondary | ICD-10-CM | POA: Diagnosis not present

## 2019-10-05 DIAGNOSIS — F419 Anxiety disorder, unspecified: Secondary | ICD-10-CM | POA: Diagnosis not present

## 2019-10-05 DIAGNOSIS — M25562 Pain in left knee: Secondary | ICD-10-CM | POA: Diagnosis not present

## 2019-11-13 DIAGNOSIS — L814 Other melanin hyperpigmentation: Secondary | ICD-10-CM | POA: Diagnosis not present

## 2019-11-13 DIAGNOSIS — L570 Actinic keratosis: Secondary | ICD-10-CM | POA: Diagnosis not present

## 2019-11-13 DIAGNOSIS — L821 Other seborrheic keratosis: Secondary | ICD-10-CM | POA: Diagnosis not present

## 2019-11-13 DIAGNOSIS — D225 Melanocytic nevi of trunk: Secondary | ICD-10-CM | POA: Diagnosis not present

## 2019-11-29 DIAGNOSIS — M170 Bilateral primary osteoarthritis of knee: Secondary | ICD-10-CM | POA: Diagnosis not present

## 2019-11-29 DIAGNOSIS — M25562 Pain in left knee: Secondary | ICD-10-CM | POA: Diagnosis not present

## 2019-11-29 DIAGNOSIS — M25512 Pain in left shoulder: Secondary | ICD-10-CM | POA: Diagnosis not present

## 2019-11-29 DIAGNOSIS — M25561 Pain in right knee: Secondary | ICD-10-CM | POA: Diagnosis not present

## 2019-11-29 DIAGNOSIS — M25511 Pain in right shoulder: Secondary | ICD-10-CM | POA: Diagnosis not present

## 2019-12-17 DIAGNOSIS — R05 Cough: Secondary | ICD-10-CM | POA: Diagnosis not present

## 2019-12-17 DIAGNOSIS — R52 Pain, unspecified: Secondary | ICD-10-CM | POA: Diagnosis not present

## 2019-12-17 DIAGNOSIS — Z20822 Contact with and (suspected) exposure to covid-19: Secondary | ICD-10-CM | POA: Diagnosis not present

## 2019-12-17 DIAGNOSIS — Z03818 Encounter for observation for suspected exposure to other biological agents ruled out: Secondary | ICD-10-CM | POA: Diagnosis not present

## 2019-12-17 DIAGNOSIS — Z20828 Contact with and (suspected) exposure to other viral communicable diseases: Secondary | ICD-10-CM | POA: Diagnosis not present

## 2020-01-02 DIAGNOSIS — M1712 Unilateral primary osteoarthritis, left knee: Secondary | ICD-10-CM | POA: Diagnosis not present

## 2020-01-03 DIAGNOSIS — M1711 Unilateral primary osteoarthritis, right knee: Secondary | ICD-10-CM | POA: Diagnosis not present

## 2020-01-04 DIAGNOSIS — M170 Bilateral primary osteoarthritis of knee: Secondary | ICD-10-CM | POA: Diagnosis not present

## 2020-01-04 DIAGNOSIS — Z79891 Long term (current) use of opiate analgesic: Secondary | ICD-10-CM | POA: Diagnosis not present

## 2020-01-04 DIAGNOSIS — M25561 Pain in right knee: Secondary | ICD-10-CM | POA: Diagnosis not present

## 2020-01-04 DIAGNOSIS — M25512 Pain in left shoulder: Secondary | ICD-10-CM | POA: Diagnosis not present

## 2020-01-08 DIAGNOSIS — M1712 Unilateral primary osteoarthritis, left knee: Secondary | ICD-10-CM | POA: Diagnosis not present

## 2020-01-10 DIAGNOSIS — M1711 Unilateral primary osteoarthritis, right knee: Secondary | ICD-10-CM | POA: Diagnosis not present

## 2020-01-16 DIAGNOSIS — M1712 Unilateral primary osteoarthritis, left knee: Secondary | ICD-10-CM | POA: Diagnosis not present

## 2020-01-17 DIAGNOSIS — M1711 Unilateral primary osteoarthritis, right knee: Secondary | ICD-10-CM | POA: Diagnosis not present

## 2020-01-18 DIAGNOSIS — J209 Acute bronchitis, unspecified: Secondary | ICD-10-CM | POA: Diagnosis not present

## 2020-01-22 DIAGNOSIS — M1712 Unilateral primary osteoarthritis, left knee: Secondary | ICD-10-CM | POA: Diagnosis not present

## 2020-01-23 DIAGNOSIS — M1711 Unilateral primary osteoarthritis, right knee: Secondary | ICD-10-CM | POA: Diagnosis not present

## 2020-01-31 DIAGNOSIS — M1712 Unilateral primary osteoarthritis, left knee: Secondary | ICD-10-CM | POA: Diagnosis not present

## 2020-02-05 DIAGNOSIS — M1711 Unilateral primary osteoarthritis, right knee: Secondary | ICD-10-CM | POA: Diagnosis not present

## 2020-02-18 DIAGNOSIS — R5383 Other fatigue: Secondary | ICD-10-CM | POA: Diagnosis not present

## 2020-02-18 DIAGNOSIS — M791 Myalgia, unspecified site: Secondary | ICD-10-CM | POA: Diagnosis not present

## 2020-03-18 ENCOUNTER — Ambulatory Visit
Admission: EM | Admit: 2020-03-18 | Discharge: 2020-03-18 | Disposition: A | Discharge status: Home / Self Care | Payer: PPO

## 2020-03-18 ENCOUNTER — Other Ambulatory Visit: Payer: Self-pay

## 2020-03-18 ENCOUNTER — Encounter (HOSPITAL_BASED_OUTPATIENT_CLINIC_OR_DEPARTMENT_OTHER): Payer: Self-pay | Admitting: *Deleted

## 2020-03-18 DIAGNOSIS — M47812 Spondylosis without myelopathy or radiculopathy, cervical region: Secondary | ICD-10-CM | POA: Diagnosis not present

## 2020-03-18 DIAGNOSIS — Z87891 Personal history of nicotine dependence: Secondary | ICD-10-CM | POA: Insufficient documentation

## 2020-03-18 DIAGNOSIS — K529 Noninfective gastroenteritis and colitis, unspecified: Secondary | ICD-10-CM | POA: Insufficient documentation

## 2020-03-18 DIAGNOSIS — R34 Anuria and oliguria: Secondary | ICD-10-CM

## 2020-03-18 DIAGNOSIS — M25562 Pain in left knee: Secondary | ICD-10-CM | POA: Diagnosis not present

## 2020-03-18 DIAGNOSIS — R197 Diarrhea, unspecified: Secondary | ICD-10-CM | POA: Diagnosis not present

## 2020-03-18 DIAGNOSIS — I700 Atherosclerosis of aorta: Secondary | ICD-10-CM | POA: Diagnosis not present

## 2020-03-18 DIAGNOSIS — E7800 Pure hypercholesterolemia, unspecified: Secondary | ICD-10-CM | POA: Diagnosis not present

## 2020-03-18 DIAGNOSIS — M170 Bilateral primary osteoarthritis of knee: Secondary | ICD-10-CM | POA: Diagnosis not present

## 2020-03-18 DIAGNOSIS — Z789 Other specified health status: Secondary | ICD-10-CM | POA: Diagnosis not present

## 2020-03-18 DIAGNOSIS — M25561 Pain in right knee: Secondary | ICD-10-CM | POA: Diagnosis not present

## 2020-03-18 DIAGNOSIS — K7460 Unspecified cirrhosis of liver: Secondary | ICD-10-CM | POA: Diagnosis not present

## 2020-03-18 DIAGNOSIS — R5383 Other fatigue: Secondary | ICD-10-CM | POA: Diagnosis not present

## 2020-03-18 DIAGNOSIS — I10 Essential (primary) hypertension: Secondary | ICD-10-CM | POA: Diagnosis not present

## 2020-03-18 DIAGNOSIS — K6389 Other specified diseases of intestine: Secondary | ICD-10-CM | POA: Diagnosis not present

## 2020-03-18 DIAGNOSIS — M791 Myalgia, unspecified site: Secondary | ICD-10-CM | POA: Diagnosis not present

## 2020-03-18 LAB — COMPREHENSIVE METABOLIC PANEL
ALT: 14 U/L (ref 0–44)
AST: 23 U/L (ref 15–41)
Albumin: 4.6 g/dL (ref 3.5–5.0)
Alkaline Phosphatase: 54 U/L (ref 38–126)
Anion gap: 13 (ref 5–15)
BUN: 16 mg/dL (ref 8–23)
CO2: 23 mmol/L (ref 22–32)
Calcium: 9.2 mg/dL (ref 8.9–10.3)
Chloride: 101 mmol/L (ref 98–111)
Creatinine, Ser: 0.82 mg/dL (ref 0.44–1.00)
GFR calc Af Amer: >60 mL/min (ref >60)
GFR calc non Af Amer: >60 mL/min (ref >60)
Glucose, Bld: 106 mg/dL — ABNORMAL HIGH (ref 70–99)
Potassium: 3.4 mmol/L — ABNORMAL LOW (ref 3.5–5.1)
Sodium: 137 mmol/L (ref 135–145)
Total Bilirubin: 0.7 mg/dL (ref 0.3–1.2)
Total Protein: 8 g/dL (ref 6.5–8.1)

## 2020-03-18 LAB — CBC WITH DIFFERENTIAL/PLATELET
Abs Immature Granulocytes: 0.01 10*3/uL (ref 0.00–0.07)
Basophils Absolute: 0 10*3/uL (ref 0.0–0.1)
Basophils Relative: 1 %
Eosinophils Absolute: 0.1 10*3/uL (ref 0.0–0.5)
Eosinophils Relative: 3 %
HCT: 48.8 % — ABNORMAL HIGH (ref 36.0–46.0)
Hemoglobin: 16.5 g/dL — ABNORMAL HIGH (ref 12.0–15.0)
Immature Granulocytes: 0 %
Lymphocytes Relative: 30 %
Lymphs Abs: 1.6 10*3/uL (ref 0.7–4.0)
MCH: 31 pg (ref 26.0–34.0)
MCHC: 33.8 g/dL (ref 30.0–36.0)
MCV: 91.7 fL (ref 80.0–100.0)
Monocytes Absolute: 0.5 10*3/uL (ref 0.1–1.0)
Monocytes Relative: 10 %
Neutro Abs: 3 10*3/uL (ref 1.7–7.7)
Neutrophils Relative %: 56 %
Platelets: 154 10*3/uL (ref 150–400)
RBC: 5.32 MIL/uL — ABNORMAL HIGH (ref 3.87–5.11)
RDW: 12.8 % (ref 11.5–15.5)
WBC: 5.4 10*3/uL (ref 4.0–10.5)
nRBC: 0 % (ref 0.0–0.2)

## 2020-03-18 LAB — URINALYSIS, ROUTINE W REFLEX MICROSCOPIC
Glucose, UA: NEGATIVE mg/dL
Hgb urine dipstick: NEGATIVE
Ketones, ur: 40 mg/dL — AB
Leukocytes,Ua: NEGATIVE
Nitrite: NEGATIVE
Protein, ur: 30 mg/dL — AB
Specific Gravity, Urine: 1.025 (ref 1.005–1.030)
pH: 6 (ref 5.0–8.0)

## 2020-03-18 LAB — LIPASE, BLOOD: Lipase: 22 U/L (ref 11–51)

## 2020-03-18 LAB — URINALYSIS, MICROSCOPIC (REFLEX): WBC, UA: NONE SEEN WBC/hpf (ref 0–5)

## 2020-03-18 NOTE — Discharge Instructions (Signed)
Recommending further evaluation and management in the ER for IV fluids, blood work, possibly stools studies, and further management.  Patient reports profuse watery diarrhea and decrease oral intake.  Patient aware and in agreement with plan.  Will go by private vehicle to ED.

## 2020-03-18 NOTE — ED Triage Notes (Signed)
Pt has had recent antibiotics

## 2020-03-18 NOTE — ED Triage Notes (Signed)
Pt sent here from [facility] for abd pain and diarrhea x 6 days

## 2020-03-18 NOTE — ED Provider Notes (Signed)
[place]   [id number] 03/18/20 Arrival Time: 6759  CC: Diarrhea  SUBJECTIVE:  Melissa [name] is a 69 y.o. female who presents with complaint of apx 8 episodes of watery diarrhea daily x 1 week.  Admits to antibiotic use 3 weeks ago.  Denies abdominal or flank pain.  Has not tried OTC medications.  Worse with eating and drinking.  States she has not been able to eat anything, and has only had a few ounces of ginger ale 3 days ago.  Denies similar symptoms in the past.  Complains of associated nausea.    Denies fever, chills, vomiting, chest pain, SOB, constipation, hematochezia, melena, dysuria, difficulty urinating, increased frequency or urgency, flank pain, loss of bladder function  No LMP recorded. Patient is postmenopausal.  ROS: As per HPI.  All other pertinent ROS negative.     Past Medical History:  Diagnosis Date  . Anxiety   . Chronic shoulder pain    h/o 3 surgeries on right shoulder. h/o 1 surgery on left shoulder  . Cirrhosis [facility])    sees Dr Loletha Grayer @ [place]. Gets MRI Q 6 mos  . Depression   . Hepatitis    sees Dr Loletha Grayer @ [facility]-has MRI Q 6 months   Past Surgical History:  Procedure Laterality Date  . ABDOMINAL HYSTERECTOMY  05/18/10   still has ovaries  . shoulder sugery     No Known Allergies No current facility-administered medications on file prior to encounter.   Current Outpatient Medications on File Prior to Encounter  Medication Sig Dispense Refill  . Fluoxetine HCl, PMDD, 20 MG TABS Take by mouth.    . ALPRAZolam (XANAX) 1 MG tablet SMARTSIG:1 Tablet(s) By Mouth 1 to 3 Times Daily    . fentaNYL (DURAGESIC) 25 MCG/HR patch Place 1 patch (25 mcg total) onto the skin every 3 (three) days. (Patient not taking: Reported on 04/26/2019) 10 patch 0  . FLUoxetine (PROZAC) 40 MG capsule TAKE ONE CAPSULE BY MOUTH DAILY 90 capsule 1   Social History   Socioeconomic History  . Marital status: Married    Spouse name: Not on file  . Number of children:  Not on file  . Years of education: Not on file  . Highest education level: Not on file  Occupational History  . Occupation: Works at [employer] in [place]  . Smoking status: Former Smoker    Types: Cigarettes    Quit date: 08/20/2009    Years since quitting: 10.5  . Smokeless tobacco: Never Used  Substance and Sexual Activity  . Alcohol use: No  . Drug use: No  . Sexual activity: Not on file  Other Topics Concern  . Not on file  Social History Narrative  . Not on file   Social Determinants of Health   Financial Resource Strain:   . Difficulty of Paying Living Expenses:   Food Insecurity:   . Worried About Charity fundraiser in the Last Year:   . Arboriculturist in the Last Year:   Transportation Needs:   . Film/video editor (Medical):   Marland Kitchen Lack of Transportation (Non-Medical):   Physical Activity:   . Days of Exercise per Week:   . Minutes of Exercise per Session:   Stress:   . Feeling of Stress :   Social Connections:   . Frequency of Communication with Friends and Family:   . Frequency of Social Gatherings with Friends and Family:   . Attends  Religious Services:   . Active Member of Clubs or Organizations:   . Attends Archivist Meetings:   Marland Kitchen Marital Status:   Intimate Partner Violence:   . Fear of Current or Ex-Partner:   . Emotionally Abused:   Marland Kitchen Physically Abused:   . Sexually Abused:    History reviewed. No pertinent family history.   OBJECTIVE:  Vitals:   03/18/20 1558  BP: 120/80  Pulse: 94  Resp: 20  Temp: 97.9 F (36.6 C)  SpO2: 97%    General appearance: Alert; NAD HEENT: NCAT.  Oropharynx clear.  Lungs: clear to auscultation bilaterally without adventitious breath sounds Heart: regular rate and rhythm.   Abdomen: soft, non-distended; normal active bowel sounds; non-tender to light and deep palpation; nontender at McBurney's point; negative Murphy's sign; no guarding Back: no CVA  tenderness Extremities: no edema; symmetrical with no gross deformities Skin: warm and dry Neurologic: normal gait Psychological: alert and cooperative; normal mood and affect  ASSESSMENT & PLAN:  1. Diarrhea, unspecified type   2. Decreased urine output     Recommending further evaluation and management in the ER for IV fluids, blood work, possibly stools studies, and further management.  Patient reports profuse watery diarrhea and decrease oral intake.  Patient aware and in agreement with plan.  Will go by private vehicle to ED.     Lestine Box, PA-C 03/18/20 [phone]

## 2020-03-18 NOTE — ED Triage Notes (Signed)
Pt has had frequent diarrhea for past 5 days . Pt states that has diarrhea immediately

## 2020-03-19 ENCOUNTER — Emergency Department (HOSPITAL_BASED_OUTPATIENT_CLINIC_OR_DEPARTMENT_OTHER)
Admission: EM | Admit: 2020-03-19 | Discharge: 2020-03-19 | Disposition: A | Discharge status: Home / Self Care | Payer: PPO | Attending: Emergency Medicine | Admitting: Emergency Medicine

## 2020-03-19 ENCOUNTER — Encounter (HOSPITAL_BASED_OUTPATIENT_CLINIC_OR_DEPARTMENT_OTHER): Payer: Self-pay | Admitting: Radiology

## 2020-03-19 ENCOUNTER — Telehealth (HOSPITAL_BASED_OUTPATIENT_CLINIC_OR_DEPARTMENT_OTHER): Payer: Self-pay | Admitting: Emergency Medicine

## 2020-03-19 ENCOUNTER — Emergency Department (HOSPITAL_BASED_OUTPATIENT_CLINIC_OR_DEPARTMENT_OTHER): Payer: PPO

## 2020-03-19 DIAGNOSIS — I700 Atherosclerosis of aorta: Secondary | ICD-10-CM | POA: Diagnosis not present

## 2020-03-19 DIAGNOSIS — K529 Noninfective gastroenteritis and colitis, unspecified: Secondary | ICD-10-CM

## 2020-03-19 DIAGNOSIS — K6389 Other specified diseases of intestine: Secondary | ICD-10-CM | POA: Diagnosis not present

## 2020-03-19 DIAGNOSIS — K7460 Unspecified cirrhosis of liver: Secondary | ICD-10-CM | POA: Diagnosis not present

## 2020-03-19 DIAGNOSIS — I10 Essential (primary) hypertension: Secondary | ICD-10-CM | POA: Diagnosis not present

## 2020-03-19 DIAGNOSIS — R197 Diarrhea, unspecified: Secondary | ICD-10-CM

## 2020-03-19 LAB — C DIFFICILE QUICK SCREEN W PCR REFLEX
C Diff antigen: NEGATIVE
C Diff interpretation: NOT DETECTED
C Diff toxin: NEGATIVE

## 2020-03-19 LAB — GASTROINTESTINAL PANEL BY PCR, STOOL (REPLACES STOOL CULTURE)
Adenovirus F40/41: DETECTED — AB
Astrovirus: NOT DETECTED
Campylobacter species: NOT DETECTED
Cryptosporidium: NOT DETECTED
Cyclospora cayetanensis: NOT DETECTED
Entamoeba histolytica: NOT DETECTED
Enteroaggregative E coli (EAEC): NOT DETECTED
Enteropathogenic E coli (EPEC): NOT DETECTED
Enterotoxigenic E coli (ETEC): NOT DETECTED
Giardia lamblia: NOT DETECTED
Norovirus GI/GII: DETECTED — AB
Plesimonas shigelloides: NOT DETECTED
Rotavirus A: NOT DETECTED
Salmonella species: NOT DETECTED
Sapovirus (I, II, IV, and V): NOT DETECTED
Shiga like toxin producing E coli (STEC): NOT DETECTED
Shigella/Enteroinvasive E coli (EIEC): NOT DETECTED
Vibrio cholerae: NOT DETECTED
Vibrio species: NOT DETECTED
Yersinia enterocolitica: NOT DETECTED

## 2020-03-19 MED ORDER — IOHEXOL 300 MG/ML  SOLN
100.0000 mL | Freq: Once | INTRAMUSCULAR | Status: AC | PRN
  Administered 2020-03-19: 100 mL via INTRAVENOUS

## 2020-03-19 MED ORDER — METRONIDAZOLE IN NACL 5-0.79 MG/ML-% IV SOLN
500.0000 mg | Freq: Once | INTRAVENOUS | Status: AC
  Administered 2020-03-19: 500 mg via INTRAVENOUS
  Filled 2020-03-19: qty 100

## 2020-03-19 MED ORDER — SODIUM CHLORIDE 0.9 % IV BOLUS
1000.0000 mL | Freq: Once | INTRAVENOUS | Status: AC
Start: 2020-03-19 — End: 2020-03-19
  Administered 2020-03-19: 1000 mL via INTRAVENOUS

## 2020-03-19 MED ORDER — METRONIDAZOLE 500 MG PO TABS: 500.0000 mg | ORAL_TABLET | Freq: Three times a day (TID) | ORAL | 0 refills | Status: DC

## 2020-03-19 NOTE — ED Provider Notes (Signed)
[facility] Provider Note: Georgena Spurling, MD, FACEP  CSN: [id number] MRN: [id number] ARRIVAL: 03/18/20 at [place]: [place]  Diarrhea   HISTORY OF PRESENT ILLNESS  03/19/20 12:28 AM Melissa [name] is a 69 y.o. female with a week of diarrhea.  She states anytime she eats or drinks anything it causes her to have the sudden urge to move her bowels.  She has had incontinence of stool as a result.  Because of these symptoms she has avoided eating or drinking.  She has some lower crampy abdominal pain when she moves her bowels but denies pain at rest.  She rates her pain as a 7 out of 10.  She has also had some mild right upper quadrant pain which is worse with palpation.  She has not had a fever.  She has not noted blood or mucus in her stool.  She was on Levaquin in June of this year.   Past Medical History:  Diagnosis Date  . Anxiety   . Chronic shoulder pain    h/o 3 surgeries on right shoulder. h/o 1 surgery on left shoulder  . Cirrhosis [facility])    sees Dr Loletha Grayer @ [place]. Gets MRI Q 6 mos  . Depression   . Hepatitis    sees Dr Loletha Grayer @ [facility]-has MRI Q 6 months    Past Surgical History:  Procedure Laterality Date  . ABDOMINAL HYSTERECTOMY  05/18/10   still has ovaries  . shoulder sugery      No family history on file.  Social History   Tobacco Use  . Smoking status: Former Smoker    Types: Cigarettes    Quit date: 08/20/2009    Years since quitting: 10.5  . Smokeless tobacco: Never Used  Substance Use Topics  . Alcohol use: No  . Drug use: No    Prior to Admission medications   Medication Sig Start Date End Date Taking? Authorizing Provider  ALPRAZolam Duanne Moron) 1 MG tablet SMARTSIG:1 Tablet(s) By Mouth 1 to 3 Times Daily 03/10/20   [provider]  FLUoxetine (PROZAC) 40 MG capsule TAKE ONE CAPSULE BY MOUTH DAILY 08/03/16   Dena Billet B, PA-C  Fluoxetine HCl, PMDD, 20 MG TABS Take by mouth. 08/14/14   [provider]    metroNIDAZOLE (FLAGYL) 500 MG tablet Take 1 tablet (500 mg total) by mouth 3 (three) times daily. 03/19/20   Rejeana Fadness, MD  Vitamin D, Ergocalciferol, (DRISDOL) 1.25 MG (50000 UNIT) CAPS capsule Take 50,000 Units by mouth once a week. 03/10/20   [provider]    Allergies Patient has no known allergies.   REVIEW OF SYSTEMS  Negative except as noted here or in the History of Present Illness.   PHYSICAL EXAMINATION  Initial Vital Signs Blood pressure 127/86, pulse (!) 103, temperature 97.8 F (36.6 C), resp. rate 14, height 5\' 9"  (1.753 m), weight 53.1 kg, SpO2 98 %.  Examination General: Well-developed, well-nourished female in no acute distress; appearance consistent with age of record HENT: normocephalic; atraumatic Eyes: pupils equal, round and reactive to light; extraocular muscles intact Neck: supple Heart: regular rate and rhythm Lungs: clear to auscultation bilaterally Abdomen: soft; nondistended; mild right upper quadrant tenderness; no masses or hepatosplenomegaly; bowel sounds present Extremities: No deformity; full range of motion; pulses normal Neurologic: Awake, alert and oriented; motor function intact in all extremities and symmetric; no facial droop Skin: Warm and dry Psychiatric: Normal mood and affect   RESULTS  Summary of this visit's results, reviewed and interpreted by myself:   EKG Interpretation  Date/Time:    Ventricular Rate:    PR Interval:    QRS Duration:   QT Interval:    QTC Calculation:   R Axis:     Text Interpretation:        Laboratory Studies: Results for orders placed or performed during the hospital encounter of 03/19/20 (from the past 24 hour(s))  Urinalysis, Routine w reflex microscopic Urine, Clean Catch     Status: Abnormal   Collection Time: 03/18/20  8:09 PM  Result Value Ref Range   Color, Urine YELLOW YELLOW   APPearance CLOUDY (A) CLEAR   Specific Gravity, Urine 1.025 1.005 - 1.030   pH 6.0 5.0 - 8.0    Glucose, UA NEGATIVE NEGATIVE mg/dL   Hgb urine dipstick NEGATIVE NEGATIVE   Bilirubin Urine MODERATE (A) NEGATIVE   Ketones, ur 40 (A) NEGATIVE mg/dL   Protein, ur 30 (A) NEGATIVE mg/dL   Nitrite NEGATIVE NEGATIVE   Leukocytes,Ua NEGATIVE NEGATIVE  CBC with Differential     Status: Abnormal   Collection Time: 03/18/20  8:09 PM  Result Value Ref Range   WBC 5.4 4.0 - 10.5 K/uL   RBC 5.32 (H) 3.87 - 5.11 MIL/uL   Hemoglobin 16.5 (H) 12.0 - 15.0 g/dL   HCT 48.8 (H) 36 - 46 %   MCV 91.7 80.0 - 100.0 fL   MCH 31.0 26.0 - 34.0 pg   MCHC 33.8 30.0 - 36.0 g/dL   RDW 12.8 11.5 - 15.5 %   Platelets 154 150 - 400 K/uL   nRBC 0.0 0.0 - 0.2 %   Neutrophils Relative % 56 %   Neutro Abs 3.0 1.7 - 7.7 K/uL   Lymphocytes Relative 30 %   Lymphs Abs 1.6 0.7 - 4.0 K/uL   Monocytes Relative 10 %   Monocytes Absolute 0.5 0 - 1 K/uL   Eosinophils Relative 3 %   Eosinophils Absolute 0.1 0 - 0 K/uL   Basophils Relative 1 %   Basophils Absolute 0.0 0 - 0 K/uL   Immature Granulocytes 0 %   Abs Immature Granulocytes 0.01 0.00 - 0.07 K/uL  Comprehensive metabolic panel     Status: Abnormal   Collection Time: 03/18/20  8:09 PM  Result Value Ref Range   Sodium 137 135 - 145 mmol/L   Potassium 3.4 (L) 3.5 - 5.1 mmol/L   Chloride 101 98 - 111 mmol/L   CO2 23 22 - 32 mmol/L   Glucose, Bld 106 (H) 70 - 99 mg/dL   BUN 16 8 - 23 mg/dL   Creatinine, Ser 0.82 0.44 - 1.00 mg/dL   Calcium 9.2 8.9 - 10.3 mg/dL   Total Protein 8.0 6.5 - 8.1 g/dL   Albumin 4.6 3.5 - 5.0 g/dL   AST 23 15 - 41 U/L   ALT 14 0 - 44 U/L   Alkaline Phosphatase 54 38 - 126 U/L   Total Bilirubin 0.7 0.3 - 1.2 mg/dL   GFR calc non Af Amer >60 >60 mL/min   GFR calc Af Amer >60 >60 mL/min   Anion gap 13 5 - 15  Lipase, blood     Status: None   Collection Time: 03/18/20  8:09 PM  Result Value Ref Range   Lipase 22 11 - 51 U/L  Urinalysis, Microscopic (reflex)     Status: Abnormal   Collection Time: 03/18/20  8:09 PM  Result Value  Ref  Range   RBC / HPF 0-5 0 - 5 RBC/hpf   WBC, UA NONE SEEN 0 - 5 WBC/hpf   Bacteria, UA RARE (A) NONE SEEN   Squamous Epithelial / LPF 0-5 0 - 5   Mucus PRESENT    Imaging Studies: CT ABDOMEN PELVIS W CONTRAST  Result Date: 03/19/2020 CLINICAL DATA:  Left upper quadrant abdominal pain and diarrhea for 6 days, history of hepatitis C EXAM: CT ABDOMEN AND PELVIS WITH CONTRAST TECHNIQUE: Multidetector CT imaging of the abdomen and pelvis was performed using the standard protocol following bolus administration of intravenous contrast. CONTRAST:  117mL OMNIPAQUE IOHEXOL 300 MG/ML  SOLN COMPARISON:  Abdominal ultrasound 11/28/2008 FINDINGS: Lower chest: Lung bases are clear. Normal heart size. No pericardial effusion. Hepatobiliary: Diffusely nodular liver surface contour compatible with cirrhosis. No visible focal liver lesions. Moderate intra and extrahepatic biliary ductal dilatation with the common bile duct measuring up to 11 mm though this is similar to comparison ultrasound as remote is 2010 and may reflect some post cholecystectomy reservoir effect without visible calcified intraductal gallstones. Pancreas: Unremarkable. No pancreatic ductal dilatation or surrounding inflammatory changes. Spleen: Splenomegaly no concerning focal splenic lesions. Adrenals/Urinary Tract: Normal adrenal glands. Kidneys are normally located with symmetric enhancement and excretion. Few subcentimeter hypoattenuating foci too small to fully characterize on CT imaging but statistically likely benign. no suspicious renal lesion, urolithiasis or hydronephrosis. Urinary bladder is largely decompressed at the time of exam and therefore poorly evaluated by CT imaging. No gross bladder abnormality accounting for underdistention. Stomach/Bowel: Venous collateralization along the distal thoracic esophagus and greater curvature of the stomach. Otherwise unremarkable appearance of the esophagus and stomach as included. Duodenum within  normal course across the midline abdomen. Much of the small bowel is fluid-filled. No focally thickened segments. Appendix is not visualized. No focal inflammation the vicinity of the cecum to suggest an occult appendicitis. Diffusely fluid-filled appearance of the colon with some mild edematous mural thickening extending from the distal transverse through the rectosigmoid. No extraluminal gas, fluid or pneumatosis. Vascular/Lymphatic: Numerous upper abdominal venous collaterals are noted including along the esophagus and stomach, as detailed above, as well as some likely splenorenal shunting. Portal and hepatic veins remain patent. Atherosclerotic plaque within the normal caliber aorta. No aneurysm or ectasia numerous upper abdominal and retroperitoneal nodes are present which may be edematous or reactive, with the largest measuring up to only 9 mm in size at the porta hepatis (2/18). Reproductive: Uterus is surgically absent. No concerning adnexal lesions. Other: Mild mid mesenteric hazy edematous changes. No abdominopelvic free air or fluid. No bowel containing hernias. Mild body wall edema. Musculoskeletal: The osseous structures appear diffusely demineralized which may limit detection of small or nondisplaced fractures. There are advanced sclerotic endplate changes and subcortical cystic changes with some associated endplate sclerosis most pronounced L2-L5 much of this has an appearance more suggestive of degenerative change particularly in the absence of paravertebral fluid collections or other infectious symptoms. Anterior wedging at T11 appears chronic in nature. No visible canal abnormality. Additional moderate degenerative changes in the bilateral SI joints and hips. IMPRESSION: 1. Diffusely fluid-filled appearance of the colon with some mild edematous mural thickening extending from the distal transverse through the rectosigmoid colon, suggestive of colitis, which could be infectious or inflammatory in  etiology or related to portal colopathy. 2. Cirrhosis with evidence of portal hypertension including splenomegaly and numerous upper abdominal venous collaterals including along the esophagus and stomach. 3. Numerous upper abdominal and retroperitoneal nodes are present  which may be edematous or reactive, with the largest measuring up to only 9 mm in size at the porta hepatis. 4. Advanced sclerotic endplate changes and subcortical cystic changes with some associated endplate sclerosis most pronounced L2-L5 much of this has an appearance more suggestive of degenerative change particularly in the absence of paravertebral fluid collections or other convincing features of infection however, if is clinical concern for discitis/osteomyelitis, consider further evaluation with MRI. 5. Moderate intra and extrahepatic biliary ductal dilatation with the common bile duct measuring up to 11 mm though this is similar to comparison ultrasound as remote is 2010 and may reflect some post cholecystectomy reservoir effect without visible calcified intraductal gallstones. 6. Aortic Atherosclerosis (ICD10-I70.0). Electronically Signed   By: Lovena Le M.D.   On: 03/19/2020 02:17    ED COURSE and MDM  Nursing notes, initial and subsequent vitals signs, including pulse oximetry, reviewed and interpreted by myself.  Vitals:   03/18/20 1904 03/18/20 1905 03/18/20 2225 03/19/20 0223  BP: (!) 147/105  127/86 125/81  Pulse: (!) 109  (!) 103 87  Resp: 18  14 14   Temp: 97.8 F (36.6 C)   98.1 F (36.7 C)  TempSrc:    Oral  SpO2: 99%  98% 100%  Weight:  53.1 kg    Height:  5\' 9"  (1.753 m)     Medications  sodium chloride 0.9 % bolus 1,000 mL (has no administration in time range)  metroNIDAZOLE (FLAGYL) IVPB 500 mg (has no administration in time range)  iohexol (OMNIPAQUE) 300 MG/ML solution 100 mL (100 mLs Intravenous Contrast Given 03/19/20 0147)   3:52 AM Stool specimen was foul-smelling and contain mucus per nurse.   Given that she was on Levaquin in June of this year she has risk for C. difficile.  We will go ahead and start her on Flagyl pending stool studies.   PROCEDURES  Procedures   ED DIAGNOSES     ICD-10-CM   1. Colitis  K52.9   2. Diarrhea of presumed infectious origin  R19.7        Shanon Rosser, MD 03/19/20 [phone]

## 2020-03-19 NOTE — ED Provider Notes (Signed)
Nursing notes and vitals signs, including pulse oximetry, reviewed.  Summary of this visit's results, reviewed by myself:  EKG:  EKG Interpretation  Date/Time:    Ventricular Rate:    PR Interval:    QRS Duration:   QT Interval:    QTC Calculation:   R Axis:     Text Interpretation:         Labs:  Results for orders placed or performed during the hospital encounter of 03/19/20 (from the past 24 hour(s))  Gastrointestinal Panel by PCR , Stool     Status: Abnormal   Collection Time: 03/19/20  1:21 AM   Specimen: Stool  Result Value Ref Range   Campylobacter species NOT DETECTED NOT DETECTED   Plesimonas shigelloides NOT DETECTED NOT DETECTED   Salmonella species NOT DETECTED NOT DETECTED   Yersinia enterocolitica NOT DETECTED NOT DETECTED   Vibrio species NOT DETECTED NOT DETECTED   Vibrio cholerae NOT DETECTED NOT DETECTED   Enteroaggregative E coli (EAEC) NOT DETECTED NOT DETECTED   Enteropathogenic E coli (EPEC) NOT DETECTED NOT DETECTED   Enterotoxigenic E coli (ETEC) NOT DETECTED NOT DETECTED   Shiga like toxin producing E coli (STEC) NOT DETECTED NOT DETECTED   Shigella/Enteroinvasive E coli (EIEC) NOT DETECTED NOT DETECTED   Cryptosporidium NOT DETECTED NOT DETECTED   Cyclospora cayetanensis NOT DETECTED NOT DETECTED   Entamoeba histolytica NOT DETECTED NOT DETECTED   Giardia lamblia NOT DETECTED NOT DETECTED   Adenovirus F40/41 DETECTED (A) NOT DETECTED   Astrovirus NOT DETECTED NOT DETECTED   Norovirus GI/GII DETECTED (A) NOT DETECTED   Rotavirus A NOT DETECTED NOT DETECTED   Sapovirus (I, II, IV, and V) NOT DETECTED NOT DETECTED  C Difficile Quick Screen w PCR reflex     Status: None   Collection Time: 03/19/20  1:21 AM   Specimen: Stool  Result Value Ref Range   C Diff antigen NEGATIVE NEGATIVE   C Diff toxin NEGATIVE NEGATIVE   C Diff interpretation No C. difficile detected.     Imaging Studies: CT ABDOMEN PELVIS W CONTRAST  Result Date:  03/19/2020 CLINICAL DATA:  Left upper quadrant abdominal pain and diarrhea for 6 days, history of hepatitis C EXAM: CT ABDOMEN AND PELVIS WITH CONTRAST TECHNIQUE: Multidetector CT imaging of the abdomen and pelvis was performed using the standard protocol following bolus administration of intravenous contrast. CONTRAST:  127mL OMNIPAQUE IOHEXOL 300 MG/ML  SOLN COMPARISON:  Abdominal ultrasound 11/28/2008 FINDINGS: Lower chest: Lung bases are clear. Normal heart size. No pericardial effusion. Hepatobiliary: Diffusely nodular liver surface contour compatible with cirrhosis. No visible focal liver lesions. Moderate intra and extrahepatic biliary ductal dilatation with the common bile duct measuring up to 11 mm though this is similar to comparison ultrasound as remote is 2010 and may reflect some post cholecystectomy reservoir effect without visible calcified intraductal gallstones. Pancreas: Unremarkable. No pancreatic ductal dilatation or surrounding inflammatory changes. Spleen: Splenomegaly no concerning focal splenic lesions. Adrenals/Urinary Tract: Normal adrenal glands. Kidneys are normally located with symmetric enhancement and excretion. Few subcentimeter hypoattenuating foci too small to fully characterize on CT imaging but statistically likely benign. no suspicious renal lesion, urolithiasis or hydronephrosis. Urinary bladder is largely decompressed at the time of exam and therefore poorly evaluated by CT imaging. No gross bladder abnormality accounting for underdistention. Stomach/Bowel: Venous collateralization along the distal thoracic esophagus and greater curvature of the stomach. Otherwise unremarkable appearance of the esophagus and stomach as included. Duodenum within normal course across the midline abdomen. Much of  the small bowel is fluid-filled. No focally thickened segments. Appendix is not visualized. No focal inflammation the vicinity of the cecum to suggest an occult appendicitis. Diffusely  fluid-filled appearance of the colon with some mild edematous mural thickening extending from the distal transverse through the rectosigmoid. No extraluminal gas, fluid or pneumatosis. Vascular/Lymphatic: Numerous upper abdominal venous collaterals are noted including along the esophagus and stomach, as detailed above, as well as some likely splenorenal shunting. Portal and hepatic veins remain patent. Atherosclerotic plaque within the normal caliber aorta. No aneurysm or ectasia numerous upper abdominal and retroperitoneal nodes are present which may be edematous or reactive, with the largest measuring up to only 9 mm in size at the porta hepatis (2/18). Reproductive: Uterus is surgically absent. No concerning adnexal lesions. Other: Mild mid mesenteric hazy edematous changes. No abdominopelvic free air or fluid. No bowel containing hernias. Mild body wall edema. Musculoskeletal: The osseous structures appear diffusely demineralized which may limit detection of small or nondisplaced fractures. There are advanced sclerotic endplate changes and subcortical cystic changes with some associated endplate sclerosis most pronounced L2-L5 much of this has an appearance more suggestive of degenerative change particularly in the absence of paravertebral fluid collections or other infectious symptoms. Anterior wedging at T11 appears chronic in nature. No visible canal abnormality. Additional moderate degenerative changes in the bilateral SI joints and hips. IMPRESSION: 1. Diffusely fluid-filled appearance of the colon with some mild edematous mural thickening extending from the distal transverse through the rectosigmoid colon, suggestive of colitis, which could be infectious or inflammatory in etiology or related to portal colopathy. 2. Cirrhosis with evidence of portal hypertension including splenomegaly and numerous upper abdominal venous collaterals including along the esophagus and stomach. 3. Numerous upper abdominal and  retroperitoneal nodes are present which may be edematous or reactive, with the largest measuring up to only 9 mm in size at the porta hepatis. 4. Advanced sclerotic endplate changes and subcortical cystic changes with some associated endplate sclerosis most pronounced L2-L5 much of this has an appearance more suggestive of degenerative change particularly in the absence of paravertebral fluid collections or other convincing features of infection however, if is clinical concern for discitis/osteomyelitis, consider further evaluation with MRI. 5. Moderate intra and extrahepatic biliary ductal dilatation with the common bile duct measuring up to 11 mm though this is similar to comparison ultrasound as remote is 2010 and may reflect some post cholecystectomy reservoir effect without visible calcified intraductal gallstones. 6. Aortic Atherosclerosis (ICD10-I70.0). Electronically Signed   By: Lovena Le M.D.   On: 03/19/2020 02:17   10:36 PM Patient contacted by telephone and advised to discontinue the Flagyl as the etiology of her diarrhea appears to be viral.  No C. difficile was detected.   Shanon Rosser, MD 03/19/20 2237

## 2020-04-30 DIAGNOSIS — F419 Anxiety disorder, unspecified: Secondary | ICD-10-CM | POA: Diagnosis not present

## 2020-04-30 DIAGNOSIS — M25562 Pain in left knee: Secondary | ICD-10-CM | POA: Diagnosis not present

## 2020-04-30 DIAGNOSIS — M25512 Pain in left shoulder: Secondary | ICD-10-CM | POA: Diagnosis not present

## 2020-04-30 DIAGNOSIS — Z79891 Long term (current) use of opiate analgesic: Secondary | ICD-10-CM | POA: Diagnosis not present

## 2020-05-02 ENCOUNTER — Other Ambulatory Visit: Payer: Self-pay | Admitting: Nurse Practitioner

## 2020-05-02 DIAGNOSIS — Z1231 Encounter for screening mammogram for malignant neoplasm of breast: Secondary | ICD-10-CM

## 2020-05-22 ENCOUNTER — Ambulatory Visit: Payer: PPO

## 2020-06-19 ENCOUNTER — Ambulatory Visit: Payer: PPO

## 2020-06-20 ENCOUNTER — Ambulatory Visit
Admission: RE | Admit: 2020-06-20 | Discharge: 2020-06-20 | Disposition: A | Discharge status: Home / Self Care | Payer: PPO | Source: Ambulatory Visit | Attending: Nurse Practitioner | Admitting: Nurse Practitioner

## 2020-06-20 ENCOUNTER — Other Ambulatory Visit: Payer: Self-pay

## 2020-06-20 DIAGNOSIS — Z1231 Encounter for screening mammogram for malignant neoplasm of breast: Secondary | ICD-10-CM | POA: Diagnosis not present

## 2020-06-25 DIAGNOSIS — M25512 Pain in left shoulder: Secondary | ICD-10-CM | POA: Diagnosis not present

## 2020-07-09 DIAGNOSIS — M19012 Primary osteoarthritis, left shoulder: Secondary | ICD-10-CM | POA: Diagnosis not present

## 2020-07-15 ENCOUNTER — Other Ambulatory Visit: Payer: Self-pay | Admitting: Orthopedic Surgery

## 2020-07-15 DIAGNOSIS — M19012 Primary osteoarthritis, left shoulder: Secondary | ICD-10-CM

## 2020-08-07 ENCOUNTER — Other Ambulatory Visit: Payer: Self-pay

## 2020-08-07 ENCOUNTER — Ambulatory Visit
Admission: RE | Admit: 2020-08-07 | Discharge: 2020-08-07 | Disposition: A | Discharge status: Home / Self Care | Payer: PPO | Source: Ambulatory Visit | Attending: Orthopedic Surgery | Admitting: Orthopedic Surgery

## 2020-08-07 DIAGNOSIS — M19012 Primary osteoarthritis, left shoulder: Secondary | ICD-10-CM | POA: Diagnosis not present

## 2020-08-07 DIAGNOSIS — G8929 Other chronic pain: Secondary | ICD-10-CM | POA: Diagnosis not present

## 2020-08-07 DIAGNOSIS — Z01818 Encounter for other preprocedural examination: Secondary | ICD-10-CM | POA: Diagnosis not present

## 2020-08-07 DIAGNOSIS — M6258 Muscle wasting and atrophy, not elsewhere classified, other site: Secondary | ICD-10-CM | POA: Diagnosis not present

## 2020-08-27 DIAGNOSIS — M19012 Primary osteoarthritis, left shoulder: Secondary | ICD-10-CM | POA: Diagnosis not present

## 2020-09-04 NOTE — Patient Instructions (Addendum)
DUE TO COVID-19 ONLY ONE VISITOR IS ALLOWED TO COME WITH YOU AND STAY IN THE WAITING ROOM ONLY DURING PRE OP AND PROCEDURE DAY OF SURGERY. THE 1 VISITOR  MAY VISIT WITH YOU AFTER SURGERY IN YOUR PRIVATE ROOM DURING VISITING HOURS ONLY!  YOU NEED TO HAVE A COVID 19 TEST ON: 09/16/20 @ 1:00 PM , THIS TEST MUST BE DONE BEFORE SURGERY,  COVID TESTING SITE [place] [address], IT IS ON THE RIGHT GOING OUT [address] APPROXIMATELY  2 MINUTES PAST [employer] ON THE RIGHT. ONCE YOUR COVID TEST IS COMPLETED,  PLEASE BEGIN THE QUARANTINE INSTRUCTIONS AS OUTLINED IN YOUR HANDOUT.                Melissa [name]    Your procedure is scheduled on: 09/19/20   Report to [facility] Main  Entrance   Report to admitting at: 10:00 AM     Call this number if you have problems the morning of surgery [phone]    Remember:   NO SOLID FOOD AFTER MIDNIGHT THE NIGHT PRIOR TO SURGERY. NOTHING BY MOUTH EXCEPT CLEAR LIQUIDS UNTIL: 9:30 AM . PLEASE FINISH ENSURE DRINK PER SURGEON ORDER  WHICH NEEDS TO BE COMPLETED AT: 9:30 AM .  CLEAR LIQUID DIET  Foods Allowed                                                                     Foods Excluded  Coffee and tea, regular and decaf                             liquids that you cannot  Plain Jell-O any favor except red or purple                                           see through such as: Fruit ices (not with fruit pulp)                                     milk, soups, orange juice  Iced Popsicles                                    All solid food Carbonated beverages, regular and diet                                    Cranberry, grape and apple juices Sports drinks like Gatorade Lightly seasoned clear broth or consume(fat free) Sugar, honey syrup  Sample Menu Breakfast                                Lunch  Supper Cranberry juice                    Beef broth                             Chicken broth Jell-O                                     Grape juice                           Apple juice Coffee or tea                        Jell-O                                      Popsicle                                                Coffee or tea                        Coffee or tea  _____________________________________________________________________   BRUSH YOUR TEETH MORNING OF SURGERY AND RINSE YOUR MOUTH OUT, NO CHEWING GUM CANDY OR MINTS.    Take these medicines the morning of surgery with A SIP OF WATER: fluoxetine,bupropion.Use inhalers and xanax as needed.                               You may not have any metal on your body including hair pins and              piercings  Do not wear jewelry, make-up, lotions, powders or perfumes, deodorant             Do not wear nail polish on your fingernails.  Do not shave  48 hours prior to surgery.    Do not bring valuables to the hospital. [place].  Contacts, dentures or bridgework may not be worn into surgery.  Leave suitcase in the car. After surgery it may be brought to your room.     Patients discharged the day of surgery will not be allowed to drive home. IF YOU ARE HAVING SURGERY AND GOING HOME THE SAME DAY, YOU MUST HAVE AN ADULT TO DRIVE YOU HOME AND BE WITH YOU FOR 24 HOURS. YOU MAY GO HOME BY TAXI OR UBER OR ORTHERWISE, BUT AN ADULT MUST ACCOMPANY YOU HOME AND STAY WITH YOU FOR 24 HOURS.  Name and phone number of your driver:  Special Instructions: N/A              Please read over the following fact sheets you were given: _____________________________________________________________________        [facility] - Preparing for Surgery Before surgery, you can play an important role.  Because skin is not sterile, your skin needs to be as free of germs as  possible.  You can reduce the number of germs on your skin by washing with CHG (chlorahexidine gluconate) soap  before surgery.  CHG is an antiseptic cleaner which kills germs and bonds with the skin to continue killing germs even after washing. Please DO NOT use if you have an allergy to CHG or antibacterial soaps.  If your skin becomes reddened/irritated stop using the CHG and inform your nurse when you arrive at Short Stay. Do not shave (including legs and underarms) for at least 48 hours prior to the first CHG shower.  You may shave your face/neck. Please follow these instructions carefully:  1.  Shower with CHG Soap the night before surgery and the  morning of Surgery.  2.  If you choose to wash your hair, wash your hair first as usual with your  normal  shampoo.  3.  After you shampoo, rinse your hair and body thoroughly to remove the  shampoo.                           4.  Use CHG as you would any other liquid soap.  You can apply chg directly  to the skin and wash                       Gently with a scrungie or clean washcloth.  5.  Apply the CHG Soap to your body ONLY FROM THE NECK DOWN.   Do not use on face/ open                           Wound or open sores. Avoid contact with eyes, ears mouth and genitals (private parts).                       Wash face,  Genitals (private parts) with your normal soap.             6.  Wash thoroughly, paying special attention to the area where your surgery  will be performed.  7.  Thoroughly rinse your body with warm water from the neck down.  8.  DO NOT shower/wash with your normal soap after using and rinsing off  the CHG Soap.                9.  Pat yourself dry with a clean towel.            10.  Wear clean pajamas.            11.  Place clean sheets on your bed the night of your first shower and do not  sleep with pets. Day of Surgery : Do not apply any lotions/deodorants the morning of surgery.  Please wear clean clothes to the hospital/surgery center.  FAILURE TO FOLLOW THESE INSTRUCTIONS MAY RESULT IN THE CANCELLATION OF YOUR SURGERY PATIENT  SIGNATURE_________________________________  NURSE SIGNATURE__________________________________  ________________________________________________________________________  [facility]- Preparing for Total Shoulder Arthroplasty    Before surgery, you can play an important role. Because skin is not sterile, your skin needs to be as free of germs as possible. You can reduce the number of germs on your skin by using the following products. . Benzoyl Peroxide Gel o Reduces the number of germs present on the skin o Applied twice a day to shoulder area starting two days before surgery    ==================================================================  Please follow these instructions carefully:  BENZOYL PEROXIDE 5% GEL  Please do not use if you have an allergy to benzoyl peroxide.   If your skin becomes reddened/irritated stop using the benzoyl peroxide.  Starting two days before surgery, apply as follows: 1. Apply benzoyl peroxide in the morning and at night. Apply after taking a shower. If you are not taking a shower clean entire shoulder front, back, and side along with the armpit with a clean wet washcloth.  2. Place a quarter-sized dollop on your shoulder and rub in thoroughly, making sure to cover the front, back, and side of your shoulder, along with the armpit.   2 days before ____ AM   ____ PM              1 day before ____ AM   ____ PM                         3. Do this twice a day for two days.  (Last application is the night before surgery, AFTER using the CHG soap as described below).  4. Do NOT apply benzoyl peroxide gel on the day of surgery.   Incentive Spirometer  An incentive spirometer is a tool that can help keep your lungs clear and active. This tool measures how well you are filling your lungs with each breath. Taking long deep breaths may help reverse or decrease the chance of developing breathing (pulmonary) problems (especially infection) following:  A long  period of time when you are unable to move or be active. BEFORE THE PROCEDURE   If the spirometer includes an indicator to show your best effort, your nurse or respiratory therapist will set it to a desired goal.  If possible, sit up straight or lean slightly forward. Try not to slouch.  Hold the incentive spirometer in an upright position. INSTRUCTIONS FOR USE  1. Sit on the edge of your bed if possible, or sit up as far as you can in bed or on a chair. 2. Hold the incentive spirometer in an upright position. 3. Breathe out normally. 4. Place the mouthpiece in your mouth and seal your lips tightly around it. 5. Breathe in slowly and as deeply as possible, raising the piston or the ball toward the top of the column. 6. Hold your breath for 3-5 seconds or for as long as possible. Allow the piston or ball to fall to the bottom of the column. 7. Remove the mouthpiece from your mouth and breathe out normally. 8. Rest for a few seconds and repeat Steps 1 through 7 at least 10 times every 1-2 hours when you are awake. Take your time and take a few normal breaths between deep breaths. 9. The spirometer may include an indicator to show your best effort. Use the indicator as a goal to work toward during each repetition. 10. After each set of 10 deep breaths, practice coughing to be sure your lungs are clear. If you have an incision (the cut made at the time of surgery), support your incision when coughing by placing a pillow or rolled up towels firmly against it. Once you are able to get out of bed, walk around indoors and cough well. You may stop using the incentive spirometer when instructed by your caregiver.  RISKS AND COMPLICATIONS  Take your time so you do not get dizzy or light-headed.  If you are in pain, you may need to take or ask for pain medication before doing incentive spirometry.  It is harder to take a deep breath if you are having pain. AFTER USE  Rest and breathe slowly and  easily.  It can be helpful to keep track of a log of your progress. Your caregiver can provide you with a simple table to help with this. If you are using the spirometer at home, follow these instructions: [place] IF:   You are having difficultly using the spirometer.  You have trouble using the spirometer as often as instructed.  Your pain medication is not giving enough relief while using the spirometer.  You develop fever of 100.5 F (38.1 C) or higher. SEEK IMMEDIATE MEDICAL CARE IF:   You cough up bloody sputum that had not been present before.  You develop fever of 102 F (38.9 C) or greater.  You develop worsening pain at or near the incision site. MAKE SURE YOU:   Understand these instructions.  Will watch your condition.  Will get help right away if you are not doing well or get worse. Document Released: 11/29/2006 Document Revised: 10/11/2011 Document Reviewed: 01/30/2007 [facility] Patient Information 2014 [place], [place].   ________________________________________________________________________

## 2020-09-08 ENCOUNTER — Encounter (HOSPITAL_COMMUNITY)
Admission: RE | Admit: 2020-09-08 | Discharge: 2020-09-08 | Disposition: A | Discharge status: Home / Self Care | Payer: PPO | Source: Ambulatory Visit | Attending: Orthopedic Surgery | Admitting: Orthopedic Surgery

## 2020-09-08 ENCOUNTER — Other Ambulatory Visit: Payer: Self-pay

## 2020-09-08 ENCOUNTER — Encounter (HOSPITAL_COMMUNITY): Payer: Self-pay

## 2020-09-08 DIAGNOSIS — Z01812 Encounter for preprocedural laboratory examination: Secondary | ICD-10-CM | POA: Diagnosis not present

## 2020-09-08 HISTORY — DX: Malignant (primary) neoplasm, unspecified: C80.1

## 2020-09-08 HISTORY — DX: Unspecified osteoarthritis, unspecified site: M19.90

## 2020-09-08 LAB — CBC
HCT: 45.6 % (ref 36.0–46.0)
Hemoglobin: 15.2 g/dL — ABNORMAL HIGH (ref 12.0–15.0)
MCH: 31.3 pg (ref 26.0–34.0)
MCHC: 33.3 g/dL (ref 30.0–36.0)
MCV: 93.8 fL (ref 80.0–100.0)
Platelets: 118 10*3/uL — ABNORMAL LOW (ref 150–400)
RBC: 4.86 MIL/uL (ref 3.87–5.11)
RDW: 12.8 % (ref 11.5–15.5)
WBC: 4.9 10*3/uL (ref 4.0–10.5)
nRBC: 0 % (ref 0.0–0.2)

## 2020-09-08 LAB — SURGICAL PCR SCREEN
MRSA, PCR: NEGATIVE
Staphylococcus aureus: NEGATIVE

## 2020-09-08 NOTE — Progress Notes (Addendum)
COVID Vaccine Completed: NO Date COVID Vaccine completed: COVID vaccine manufacturer: [place]   PCP - Dr. Leslie Andrea Cardiologist -   Chest x-ray -  EKG -  Stress Test -  ECHO -  Cardiac Cath -  Pacemaker/ICD device last checked:  Sleep Study -  CPAP -   Fasting Blood Sugar -  Checks Blood Sugar _____ times a day  Blood Thinner Instructions: Aspirin Instructions: Last Dose:  Anesthesia review:   Patient denies shortness of breath, fever, cough and chest pain at PAT appointment   Patient verbalized understanding of instructions that were given to them at the PAT appointment. Patient was also instructed that they will need to review over the PAT instructions again at home before surgery.

## 2020-09-08 NOTE — Progress Notes (Signed)
Pt. verbalized during PAT appointment that she's schedule to have her left shoulder done.Consent and schedule said right shoulder.Can you please clarify that.Thanks.

## 2020-09-16 ENCOUNTER — Other Ambulatory Visit (HOSPITAL_COMMUNITY)
Admission: RE | Admit: 2020-09-16 | Discharge: 2020-09-16 | Disposition: A | Discharge status: Home / Self Care | Payer: PPO | Source: Ambulatory Visit | Attending: Orthopedic Surgery | Admitting: Orthopedic Surgery

## 2020-09-16 DIAGNOSIS — Z01812 Encounter for preprocedural laboratory examination: Secondary | ICD-10-CM | POA: Insufficient documentation

## 2020-09-16 DIAGNOSIS — Z20822 Contact with and (suspected) exposure to covid-19: Secondary | ICD-10-CM | POA: Insufficient documentation

## 2020-09-16 LAB — SARS CORONAVIRUS 2 (TAT 6-24 HRS): SARS Coronavirus 2: NEGATIVE

## 2020-09-19 ENCOUNTER — Encounter (HOSPITAL_COMMUNITY): Payer: Self-pay | Admitting: Orthopedic Surgery

## 2020-09-19 ENCOUNTER — Encounter (HOSPITAL_COMMUNITY)
Admission: RE | Disposition: A | Discharge status: Home / Self Care | Payer: Self-pay | Source: Ambulatory Visit | Attending: Orthopedic Surgery

## 2020-09-19 ENCOUNTER — Ambulatory Visit (HOSPITAL_COMMUNITY): Payer: PPO

## 2020-09-19 ENCOUNTER — Ambulatory Visit (HOSPITAL_COMMUNITY)
Admission: RE | Admit: 2020-09-19 | Discharge: 2020-09-19 | Disposition: A | Discharge status: Home / Self Care | Payer: PPO | Source: Ambulatory Visit | Attending: Orthopedic Surgery | Admitting: Orthopedic Surgery

## 2020-09-19 ENCOUNTER — Ambulatory Visit (HOSPITAL_COMMUNITY): Payer: PPO | Admitting: Anesthesiology

## 2020-09-19 DIAGNOSIS — M75102 Unspecified rotator cuff tear or rupture of left shoulder, not specified as traumatic: Secondary | ICD-10-CM | POA: Insufficient documentation

## 2020-09-19 DIAGNOSIS — Z87891 Personal history of nicotine dependence: Secondary | ICD-10-CM | POA: Diagnosis not present

## 2020-09-19 DIAGNOSIS — F418 Other specified anxiety disorders: Secondary | ICD-10-CM | POA: Diagnosis not present

## 2020-09-19 DIAGNOSIS — M19012 Primary osteoarthritis, left shoulder: Secondary | ICD-10-CM | POA: Diagnosis not present

## 2020-09-19 DIAGNOSIS — Z96612 Presence of left artificial shoulder joint: Secondary | ICD-10-CM | POA: Diagnosis not present

## 2020-09-19 DIAGNOSIS — M75121 Complete rotator cuff tear or rupture of right shoulder, not specified as traumatic: Secondary | ICD-10-CM | POA: Diagnosis not present

## 2020-09-19 DIAGNOSIS — M129 Arthropathy, unspecified: Secondary | ICD-10-CM | POA: Diagnosis not present

## 2020-09-19 DIAGNOSIS — K7460 Unspecified cirrhosis of liver: Secondary | ICD-10-CM | POA: Diagnosis not present

## 2020-09-19 DIAGNOSIS — B1920 Unspecified viral hepatitis C without hepatic coma: Secondary | ICD-10-CM | POA: Diagnosis not present

## 2020-09-19 DIAGNOSIS — M12811 Other specific arthropathies, not elsewhere classified, right shoulder: Secondary | ICD-10-CM | POA: Diagnosis not present

## 2020-09-19 DIAGNOSIS — G8918 Other acute postprocedural pain: Secondary | ICD-10-CM | POA: Diagnosis not present

## 2020-09-19 DIAGNOSIS — Z79899 Other long term (current) drug therapy: Secondary | ICD-10-CM | POA: Insufficient documentation

## 2020-09-19 DIAGNOSIS — Z471 Aftercare following joint replacement surgery: Secondary | ICD-10-CM | POA: Diagnosis not present

## 2020-09-19 HISTORY — PX: REVERSE SHOULDER ARTHROPLASTY: SHX5054

## 2020-09-19 SURGERY — ARTHROPLASTY, SHOULDER, TOTAL, REVERSE
Anesthesia: General | Site: Shoulder | Laterality: Left

## 2020-09-19 MED ORDER — PHENYLEPHRINE HCL-NACL 10-0.9 MG/250ML-% IV SOLN
INTRAVENOUS | Status: DC | PRN
  Administered 2020-09-19: 25 ug/min via INTRAVENOUS

## 2020-09-19 MED ORDER — MIDAZOLAM HCL 2 MG/2ML IJ SOLN
INTRAMUSCULAR | Status: AC
  Filled 2020-09-19: qty 2

## 2020-09-19 MED ORDER — LIDOCAINE 2% (20 MG/ML) 5 ML SYRINGE
INTRAMUSCULAR | Status: DC | PRN
  Administered 2020-09-19: 50 mg via INTRAVENOUS

## 2020-09-19 MED ORDER — BUPIVACAINE HCL (PF) 0.5 % IJ SOLN
INTRAMUSCULAR | Status: DC | PRN
  Administered 2020-09-19: 15 mL via PERINEURAL

## 2020-09-19 MED ORDER — PROPOFOL 10 MG/ML IV BOLUS
INTRAVENOUS | Status: AC
  Filled 2020-09-19: qty 20

## 2020-09-19 MED ORDER — CEFAZOLIN SODIUM-DEXTROSE 2-4 GM/100ML-% IV SOLN
2.0000 g | INTRAVENOUS | Status: AC
  Administered 2020-09-19: 2 g via INTRAVENOUS
  Filled 2020-09-19: qty 100

## 2020-09-19 MED ORDER — MIDAZOLAM HCL 2 MG/2ML IJ SOLN
1.0000 mg | INTRAMUSCULAR | Status: AC
  Administered 2020-09-19: 1 mg via INTRAVENOUS

## 2020-09-19 MED ORDER — PROPOFOL 10 MG/ML IV BOLUS
INTRAVENOUS | Status: DC | PRN
  Administered 2020-09-19: 120 mg via INTRAVENOUS

## 2020-09-19 MED ORDER — ONDANSETRON HCL 4 MG PO TABS: 4.0000 mg | ORAL_TABLET | Freq: Every day | ORAL | 1 refills | Status: AC | PRN

## 2020-09-19 MED ORDER — BUPIVACAINE LIPOSOME 1.3 % IJ SUSP
INTRAMUSCULAR | Status: DC | PRN
  Administered 2020-09-19: 10 mL via PERINEURAL

## 2020-09-19 MED ORDER — METHOCARBAMOL 500 MG PO TABS: 500.0000 mg | ORAL_TABLET | Freq: Four times a day (QID) | ORAL | 0 refills | Status: DC | PRN

## 2020-09-19 MED ORDER — FENTANYL CITRATE (PF) 250 MCG/5ML IJ SOLN
INTRAMUSCULAR | Status: AC
  Filled 2020-09-19: qty 5

## 2020-09-19 MED ORDER — FENTANYL CITRATE (PF) 100 MCG/2ML IJ SOLN
50.0000 ug | INTRAMUSCULAR | Status: AC
  Administered 2020-09-19: 50 ug via INTRAVENOUS

## 2020-09-19 MED ORDER — TRANEXAMIC ACID-NACL 1000-0.7 MG/100ML-% IV SOLN
1000.0000 mg | INTRAVENOUS | Status: AC
  Administered 2020-09-19: 1000 mg via INTRAVENOUS
  Filled 2020-09-19: qty 100

## 2020-09-19 MED ORDER — ORAL CARE MOUTH RINSE: 15.0000 mL | Freq: Once | OROMUCOSAL | Status: AC

## 2020-09-19 MED ORDER — DEXAMETHASONE SODIUM PHOSPHATE 10 MG/ML IJ SOLN
INTRAMUSCULAR | Status: DC | PRN
  Administered 2020-09-19: 10 mg via INTRAVENOUS

## 2020-09-19 MED ORDER — FENTANYL CITRATE (PF) 100 MCG/2ML IJ SOLN
INTRAMUSCULAR | Status: AC
  Filled 2020-09-19: qty 2

## 2020-09-19 MED ORDER — OXYCODONE HCL 5 MG PO TABS
5.0000 mg | ORAL_TABLET | ORAL | 0 refills | Status: AC | PRN
Start: 2020-09-19 — End: 2021-09-19

## 2020-09-19 MED ORDER — ONDANSETRON HCL 4 MG/2ML IJ SOLN
INTRAMUSCULAR | Status: DC | PRN
  Administered 2020-09-19: 4 mg via INTRAVENOUS

## 2020-09-19 MED ORDER — FENTANYL CITRATE (PF) 100 MCG/2ML IJ SOLN
INTRAMUSCULAR | Status: DC | PRN
  Administered 2020-09-19 (×3): 50 ug via INTRAVENOUS

## 2020-09-19 MED ORDER — VANCOMYCIN HCL 1000 MG IV SOLR
INTRAVENOUS | Status: AC
  Filled 2020-09-19: qty 1000

## 2020-09-19 MED ORDER — LACTATED RINGERS IV SOLN: INTRAVENOUS | Status: DC

## 2020-09-19 MED ORDER — VANCOMYCIN HCL 1000 MG IV SOLR
INTRAVENOUS | Status: DC | PRN
  Administered 2020-09-19: 1000 mg via TOPICAL

## 2020-09-19 MED ORDER — SUGAMMADEX SODIUM 200 MG/2ML IV SOLN
INTRAVENOUS | Status: DC | PRN
  Administered 2020-09-19: 100 mg via INTRAVENOUS

## 2020-09-19 MED ORDER — 0.9 % SODIUM CHLORIDE (POUR BTL) OPTIME
TOPICAL | Status: DC | PRN
  Administered 2020-09-19: 1000 mL

## 2020-09-19 MED ORDER — ONDANSETRON HCL 4 MG/2ML IJ SOLN
INTRAMUSCULAR | Status: AC
  Filled 2020-09-19: qty 2

## 2020-09-19 MED ORDER — ALBUMIN HUMAN 5 % IV SOLN
INTRAVENOUS | Status: DC | PRN
Start: 2020-09-19 — End: 2020-09-19

## 2020-09-19 MED ORDER — CHLORHEXIDINE GLUCONATE 0.12 % MT SOLN
15.0000 mL | Freq: Once | OROMUCOSAL | Status: AC
  Administered 2020-09-19: 15 mL via OROMUCOSAL

## 2020-09-19 MED ORDER — ROCURONIUM BROMIDE 10 MG/ML (PF) SYRINGE
PREFILLED_SYRINGE | INTRAVENOUS | Status: DC | PRN
  Administered 2020-09-19: 70 mg via INTRAVENOUS

## 2020-09-19 MED ORDER — MIDAZOLAM HCL 5 MG/5ML IJ SOLN
INTRAMUSCULAR | Status: DC | PRN
  Administered 2020-09-19: 1 mg via INTRAVENOUS

## 2020-09-19 MED ORDER — DEXAMETHASONE SODIUM PHOSPHATE 10 MG/ML IJ SOLN
INTRAMUSCULAR | Status: AC
  Filled 2020-09-19: qty 1

## 2020-09-19 MED ORDER — FENTANYL CITRATE (PF) 100 MCG/2ML IJ SOLN
25.0000 ug | INTRAMUSCULAR | Status: DC | PRN
Start: 2020-09-19 — End: 2020-09-20

## 2020-09-19 SURGICAL SUPPLY — 72 items
AID PSTN UNV HD RSTRNT DISP (MISCELLANEOUS)
APL PRP STRL LF DISP 70% ISPRP (MISCELLANEOUS)
BAG SPEC THK2 15X12 ZIP CLS (MISCELLANEOUS) ×1
BAG ZIPLOCK 12X15 (MISCELLANEOUS) ×2 IMPLANT
BASEPLATE AUG FULL 24X20 +2 (Plate) ×1 IMPLANT
BASEPLATE MOD POST AUGM MGS 20 (Plate) ×1 IMPLANT
BIT DRILL FLUTED 3.0 STRL (BIT) ×2 IMPLANT
BLADE HEX COATED 2.75 (ELECTRODE) ×1 IMPLANT
BLADE SAG 18X100X1.27 (BLADE) ×2 IMPLANT
CHLORAPREP W/TINT 26 (MISCELLANEOUS) IMPLANT
CNTNR URN SCR LID CUP LEK RST (MISCELLANEOUS) IMPLANT
CONT SPEC 4OZ STRL OR WHT (MISCELLANEOUS) ×2
COVER BACK TABLE 60X90IN (DRAPES) ×2 IMPLANT
COVER SURGICAL LIGHT HANDLE (MISCELLANEOUS) ×2 IMPLANT
COVER WAND RF STERILE (DRAPES) IMPLANT
CUP SUT UNIV REVERS 36+2 LEFT (Cup) ×2 IMPLANT
DRAPE ORTHO SPLIT 77X108 STRL (DRAPES) ×4
DRAPE SHEET LG 3/4 BI-LAMINATE (DRAPES) ×2 IMPLANT
DRAPE SURG 17X11 SM STRL (DRAPES) ×2 IMPLANT
DRAPE SURG ORHT 6 SPLT 77X108 (DRAPES) ×2 IMPLANT
DRAPE TOP 10253 STERILE (DRAPES) ×2 IMPLANT
DRAPE U-SHAPE 47X51 STRL (DRAPES) ×2 IMPLANT
DRESSING AQUACEL AG SP 3.5X10 (GAUZE/BANDAGES/DRESSINGS) IMPLANT
DRSG AQUACEL AG ADV 3.5X 6 (GAUZE/BANDAGES/DRESSINGS) IMPLANT
DRSG AQUACEL AG ADV 3.5X10 (GAUZE/BANDAGES/DRESSINGS) ×2 IMPLANT
DRSG AQUACEL AG SP 3.5X10 (GAUZE/BANDAGES/DRESSINGS) ×2
ELECT REM PT RETURN 15FT ADLT (MISCELLANEOUS) ×2 IMPLANT
FACESHIELD WRAPAROUND (MASK) ×2 IMPLANT
FACESHIELD WRAPAROUND OR TEAM (MASK) ×1 IMPLANT
GLENOSPHERE 33+4 LAT/24 UNI RV (Joint) ×1 IMPLANT
GLOVE SRG 8 PF TXTR STRL LF DI (GLOVE) ×2 IMPLANT
GLOVE SURG ENC MOIS LTX SZ7.5 (GLOVE) ×8 IMPLANT
GLOVE SURG UNDER POLY LF SZ8 (GLOVE) ×4
GOWN STRL REUS W/ TWL XL LVL3 (GOWN DISPOSABLE) ×2 IMPLANT
GOWN STRL REUS W/TWL XL LVL3 (GOWN DISPOSABLE) ×4
INSERT HUMERAL 36 +3/33 COMBO (Joint) ×1 IMPLANT
KIT BASIN OR (CUSTOM PROCEDURE TRAY) ×2 IMPLANT
KIT TURNOVER KIT A (KITS) ×2 IMPLANT
MANIFOLD NEPTUNE II (INSTRUMENTS) ×2 IMPLANT
NDL TAPERED W/ NITINOL LOOP (MISCELLANEOUS) IMPLANT
NEEDLE TAPERED W/ NITINOL LOOP (MISCELLANEOUS) IMPLANT
NS IRRIG 1000ML POUR BTL (IV SOLUTION) ×2 IMPLANT
PACK SHOULDER (CUSTOM PROCEDURE TRAY) ×2 IMPLANT
PENCIL SMOKE EVACUATOR (MISCELLANEOUS) IMPLANT
PIN NITINOL TARGETER 2.8 (PIN) ×1 IMPLANT
PROTECTOR NERVE ULNAR (MISCELLANEOUS) ×2 IMPLANT
REAMER ANGLED HEAD SMALL (DRILL) ×1 IMPLANT
RESTRAINT HEAD UNIVERSAL NS (MISCELLANEOUS) IMPLANT
SCREW GLENOSPHERE (Miscellaneous) ×1 IMPLANT
SCREW PERI LOCK 5.5X16 (Screw) ×4 IMPLANT
SCREW PERI NL 4.5X32 (Screw) ×1 IMPLANT
SCREW PERIPHERAL 5.5X28 LOCK (Screw) ×2 IMPLANT
SLING ARM FOAM STRAP MED (SOFTGOODS) ×2 IMPLANT
SMARTMIX MINI TOWER (MISCELLANEOUS)
SPONGE LAP 4X18 RFD (DISPOSABLE) IMPLANT
STEM HUMERAL UNIVER REV SIZE 7 (Stem) ×1 IMPLANT
STRIP CLOSURE SKIN 1/2X4 (GAUZE/BANDAGES/DRESSINGS) ×2 IMPLANT
SUCTION FRAZIER HANDLE 10FR (MISCELLANEOUS) ×2
SUCTION TUBE FRAZIER 10FR DISP (MISCELLANEOUS) ×1 IMPLANT
SUT FIBERWIRE #2 38 T-5 BLUE (SUTURE)
SUT MON AB 3-0 SH 27 (SUTURE) ×2
SUT MON AB 3-0 SH27 (SUTURE) ×1 IMPLANT
SUT VIC AB 0 CT1 36 (SUTURE) ×2 IMPLANT
SUT VIC AB 1 CT1 36 (SUTURE) ×2 IMPLANT
SUT VIC AB 2-0 CT1 27 (SUTURE) ×2
SUT VIC AB 2-0 CT1 TAPERPNT 27 (SUTURE) ×1 IMPLANT
SUTURE FIBERWR #2 38 T-5 BLUE (SUTURE) IMPLANT
SUTURE TAPE 1.3 40 TPR END (SUTURE) ×2 IMPLANT
SUTURETAPE 1.3 40 TPR END (SUTURE) ×4
TOWEL OR 17X26 10 PK STRL BLUE (TOWEL DISPOSABLE) ×2 IMPLANT
TOWER SMARTMIX MINI (MISCELLANEOUS) IMPLANT
WATER STERILE IRR 1000ML POUR (IV SOLUTION) ×2 IMPLANT

## 2020-09-19 NOTE — Anesthesia Procedure Notes (Signed)
Anesthesia Procedure Image    

## 2020-09-19 NOTE — Op Note (Signed)
09/19/2020  4:37 PM  PATIENT:  Melissa [name]    PRE-OPERATIVE DIAGNOSIS:  1. Advanced Left shoulder rotator cuff arthropathy  POST-OPERATIVE DIAGNOSIS:  Same  PROCEDURE:   1. LEFT  REVERSE SHOULDER ARTHROPLASTY 2.  Left shoulder transfer of long head of biceps tendon to pectoralis major tendon  SURGEON:  Nicholes Stairs, MD  ASSISTANT: Jonelle Sidle, PA-C  Assistant attestation:  PA Thereasa Solo was utilized throughout the procedure for positioning the patient, approach to the shoulder, deep retractor placement, implantation of reverse shoulder arthroplasty components and closure of wound.  ANESTHESIA:   General  ESTIMATED BLOOD LOSS: 400 cc  PREOPERATIVE INDICATIONS:  Melissa [name] is a  70 y.o. female with a diagnosis of Left shoulder rotator cuff arthropathy who failed conservative measures and elected for surgical management.    The risks benefits and alternatives were discussed with the patient preoperatively including but not limited to the risks of infection, bleeding, nerve injury, cardiopulmonary complications, the need for revision surgery, dislocation, brachial plexus palsy, incomplete relief of pain, among others, and the patient was willing to proceed.  OPERATIVE IMPLANTS:  Arthrex reverse stem, size 7 Standard tray with 33 + 3 poly Full 20 degree superior-posterior augment with 20 mm post Inferior baseplate non locking 28 mm screw 3 peripheral locking screws on baseplate 33 + 2 glenosphere Mini base plate with +4 mm offset  OPERATIVE FINDINGS:  Advanced rotator cuff arthropathy with partially torn subscapularis with full-thickness and retracted tears of the supraspinatus and infraspinatus with retraction to the glenoid.  The long head of the biceps tendon was macerated and flattened consistent with chronic longstanding high riding humeral head.  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  There was complete cartilage loss of the humeral head and glenoid fossa.  There was posterior erosion of the glenoid as well as advanced medialization nearly to the level of the base of the coracoid process.  OPERATIVE PROCEDURE: The patient was brought to the operating room and placed in the supine position. General anesthesia was administered. IV antibiotics were given. A Foley was placed. Time out was performed. The upper extremity was prepped and draped in usual sterile fashion. The patient was in a beachchair position. Deltopectoral approach was carried out. The biceps was transfered to the pectoralis tendon with #2 Fiberwire.  The bicep tendon itself was noted to be quite macerated and flattened consistent with chronic rotator cuff arthropathy changes.  The subscapularis was released off of the bone.   I then performed circumferential releases of the humerus, and then dislocated the head, and then reamed with the reamer to the above named size.  I then applied the jig, and cut the humeral head in 30 of retroversion, and then turned my attention to the glenoid.  Deep retractors were placed, and I resected the labrum, and then placed a guidepin into the center position on the glenoid, with slight inferior inclination.  We used the patient specific instrumentation to place the central pin.  This was in place and position for the full 20 degree wedge augment.  We then reamed with the 20 degree augment wedge reamer.  This was placed in approximately the 1 o'clock position while looking at the left glenoid face. The base plate was selected and impacted place, and then I secured it inferiorly with a nonlocking screw, and then filled the remaining 3 peripheral screws  with locking 5.0 millimeter screws.  Baseplate was a +4 offset.  We then impacted the 33 mm glenosphere with +2 offset into place.  Central set screw was secured and tightened.   I  sequentially broached, and then trialed, and was found to restore soft tissue tension, and it had 2 finger tightness. Therefore the above named components were selected. The shoulder felt stable throughout functional motion.  I then impacted the real prosthesis into place, as well as the real humeral tray, and reduced the shoulder. The shoulder had excellent motion, and was stable, and I irrigated the wounds copiously.   Subscapularis was not repaired.  A gram of vancomycin powder was placed in the deltopectoral interval prior to closure.  I then irrigated the shoulder copiously once more, repaired the deltopectoral interval with #1 Vicryl, followed by subcutaneous Vicryl, then monocryl for the skin,  with Steri-Strips and sterile gauze for the skin. The patient was awakened and returned back in stable and satisfactory condition. There no complications and they tolerated the procedure well.  All counts were correct x2.  Disposition:  Melissa [name] will be nonweightbearing to the left upper extremity.  She will don her sling at all times unless doing activities of daily living or daily exercise.  She will discharge home from PACU.  I will see her back in the office in 2 weeks.  Maintain postoperative bandage until follow-up.

## 2020-09-19 NOTE — Discharge Instructions (Signed)
-Maintain postoperative bandage until your follow-up appointment.  You can shower with this in place, but do not submerge underwater.  -Maintain sling to the left upper extremity for comfort.  You may remove the sling for activities of daily living and light exercise.  No lifting over 2 pounds.  You should also avoid internal rotation and external rotation beyond 30 degrees.  -For mild to moderate pain use Tylenol and Advil around-the-clock.  For breakthrough pain use oxycodone as necessary.  -Apply ice to the left shoulder for 20 to 30 minutes out of each hour.  She is around-the-clock and as often as possible throughout the day.  -Return in 2 weeks for routine postoperative care.   General Anesthesia, Adult, Care After This sheet gives you information about how to care for yourself after your procedure. Your health care provider may also give you more specific instructions. If you have problems or questions, contact your health care provider. What can I expect after the procedure? After the procedure, the following side effects are common:  Pain or discomfort at the IV site.  Nausea.  Vomiting.  Sore throat.  Trouble concentrating.  Feeling cold or chills.  Feeling weak or tired.  Sleepiness and fatigue.  Soreness and body aches. These side effects can affect parts of the body that were not involved in surgery. Follow these instructions at home: For the time period you were told by your health care provider:  Rest.  Do not participate in activities where you could fall or become injured.  Do not drive or use machinery.  Do not drink alcohol.  Do not take sleeping pills or medicines that cause drowsiness.  Do not make important decisions or sign legal documents.  Do not take care of children on your own.   Eating and drinking  Follow any instructions from your health care provider about eating or drinking restrictions.  When you feel hungry, start by eating small  amounts of foods that are soft and easy to digest (bland), such as toast. Gradually return to your regular diet.  Drink enough fluid to keep your urine pale yellow.  If you vomit, rehydrate by drinking water, juice, or clear broth. General instructions  If you have sleep apnea, surgery and certain medicines can increase your risk for breathing problems. Follow instructions from your health care provider about wearing your sleep device: ? Anytime you are sleeping, including during daytime naps. ? While taking prescription pain medicines, sleeping medicines, or medicines that make you drowsy.  Have a responsible adult stay with you for the time you are told. It is important to have someone help care for you until you are awake and alert.  Return to your normal activities as told by your health care provider. Ask your health care provider what activities are safe for you.  Take over-the-counter and prescription medicines only as told by your health care provider.  If you smoke, do not smoke without supervision.  Keep all follow-up visits as told by your health care provider. This is important. Contact a health care provider if:  You have nausea or vomiting that does not get better with medicine.  You cannot eat or drink without vomiting.  You have pain that does not get better with medicine.  You are unable to pass urine.  You develop a skin rash.  You have a fever.  You have redness around your IV site that gets worse. Get help right away if:  You have difficulty breathing.  You have chest pain.  You have blood in your urine or stool, or you vomit blood. Summary  After the procedure, it is common to have a sore throat or nausea. It is also common to feel tired.  Have a responsible adult stay with you for the time you are told. It is important to have someone help care for you until you are awake and alert.  When you feel hungry, start by eating small amounts of foods  that are soft and easy to digest (bland), such as toast. Gradually return to your regular diet.  Drink enough fluid to keep your urine pale yellow.  Return to your normal activities as told by your health care provider. Ask your health care provider what activities are safe for you. This information is not intended to replace advice given to you by your health care provider. Make sure you discuss any questions you have with your health care provider. Document Revised: 04/03/2020 Document Reviewed: 11/01/2019 Elsevier Patient Education  2021 [employer].

## 2020-09-19 NOTE — Anesthesia Preprocedure Evaluation (Signed)
Anesthesia Evaluation  Patient identified by MRN, date of birth, ID band Patient awake    Reviewed: Allergy & Precautions, H&P , NPO status , Patient's Chart, lab work & pertinent test results  Airway Mallampati: II  TM Distance: >3 FB Neck ROM: Full    Dental no notable dental hx.    Pulmonary neg pulmonary ROS, former smoker,    Pulmonary exam normal breath sounds clear to auscultation       Cardiovascular negative cardio ROS Normal cardiovascular exam Rhythm:Regular Rate:Normal     Neuro/Psych negative neurological ROS  negative psych ROS   GI/Hepatic negative GI ROS, (+) Cirrhosis       , Hepatitis -, C  Endo/Other  negative endocrine ROS  Renal/GU negative Renal ROS  negative genitourinary   Musculoskeletal negative musculoskeletal ROS (+)   Abdominal   Peds negative pediatric ROS (+)  Hematology negative hematology ROS (+)   Anesthesia Other Findings   Reproductive/Obstetrics negative OB ROS                             Anesthesia Physical Anesthesia Plan  ASA: III  Anesthesia Plan: General   Post-op Pain Management:  Regional for Post-op pain   Induction: Intravenous  PONV Risk Score and Plan: 3 and Ondansetron, Dexamethasone and Treatment may vary due to age or medical condition  Airway Management Planned:   Additional Equipment:   Intra-op Plan:   Post-operative Plan: Extubation in OR  Informed Consent: I have reviewed the patients History and Physical, chart, labs and discussed the procedure including the risks, benefits and alternatives for the proposed anesthesia with the patient or authorized representative who has indicated his/her understanding and acceptance.     Dental advisory given  Plan Discussed with: CRNA and Surgeon  Anesthesia Plan Comments:         Anesthesia Quick Evaluation

## 2020-09-19 NOTE — H&P (Signed)
ORTHOPAEDIC H and P  REQUESTING PHYSICIAN: Nicholes Stairs, MD  PCP:  Patient, No Pcp Per  Chief Complaint: Left shoulder rotator cuff arthropathy  HPI: Melissa [name] is a 70 y.o. female who complains of left shoulder pseudoparesis and pain.  She has failed conservative treatment is here today for definitive management with reverse shoulder arthroplasty.  No new complaints.  Past Medical History:  Diagnosis Date  . Anxiety   . Arthritis   . Cancer ([facility])    skin Ca face,arms,back  . Chronic shoulder pain    h/o 3 surgeries on right shoulder. h/o 1 surgery on left shoulder  . Cirrhosis [facility])    sees Dr Loletha Grayer @ [place]. Gets MRI Q 6 mos  . Depression   . Hepatitis    sees Dr Loletha Grayer @ [facility]-has MRI Q 6 months   Past Surgical History:  Procedure Laterality Date  . ABDOMINAL HYSTERECTOMY  05/18/10   still has ovaries  . APPENDECTOMY    . CHOLECYSTECTOMY    . EYE SURGERY     catarac  . shoulder sugery     3 times   Social History   Socioeconomic History  . Marital status: Married    Spouse name: Not on file  . Number of children: Not on file  . Years of education: Not on file  . Highest education level: Not on file  Occupational History  . Occupation: Works at [employer] in [place]  . Smoking status: Former Smoker    Types: Cigarettes    Quit date: 08/20/2009    Years since quitting: 11.0  . Smokeless tobacco: Never Used  Vaping Use  . Vaping Use: Never used  Substance and Sexual Activity  . Alcohol use: No  . Drug use: No  . Sexual activity: Not on file  Other Topics Concern  . Not on file  Social History Narrative  . Not on file   Social Determinants of Health   Financial Resource Strain: Not on file  Food Insecurity: Not on file  Transportation Needs: Not on file  Physical Activity: Not on file  Stress: Not on file  Social Connections: Not on file   History reviewed. No pertinent family history. No Known  Allergies Prior to Admission medications   Medication Sig Start Date End Date Taking? Authorizing Provider  albuterol (VENTOLIN HFA) 108 (90 Base) MCG/ACT inhaler Inhale 2 puffs into the lungs every 6 (six) hours as needed for wheezing or shortness of breath.   Yes [provider]  ALPRAZolam Duanne Moron) 1 MG tablet Take 1 mg by mouth 3 (three) times daily as needed for anxiety. 03/10/20  Yes [provider]  buPROPion (WELLBUTRIN XL) 300 MG 24 hr tablet Take 300 mg by mouth daily. 06/09/20  Yes [provider]  FLUoxetine (PROZAC) 40 MG capsule TAKE ONE CAPSULE BY MOUTH DAILY Patient taking differently: Take 40 mg by mouth daily. 08/03/16  Yes Dena Billet B, PA-C  GLUTATHIONE PO Take 1 each by mouth daily. Gel   Yes [provider]  vitamin C (ASCORBIC ACID) 500 MG tablet Take 500 mg by mouth daily.   Yes [provider]  Vitamin D, Ergocalciferol, (DRISDOL) 1.25 MG (50000 UNIT) CAPS capsule Take 50,000 Units by mouth once a week. 03/10/20  Yes [provider]   No results found.  Positive ROS: All other systems have been reviewed and were otherwise negative with the exception of those mentioned in the HPI and  as above.  Physical Exam: General: Alert, no acute distress Cardiovascular: No pedal edema Respiratory: No cyanosis, no use of accessory musculature GI: No organomegaly, abdomen is soft and non-tender Skin: No lesions in the area of chief complaint Neurologic: Sensation intact distally Psychiatric: Patient is competent for consent with normal mood and affect Lymphatic: No axillary or cervical lymphadenopathy  MUSCULOSKELETAL: Left upper extremity is warm and well-perfused with no open wounds.  Neurovascular intact.  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  Plan: -Plan to proceed today with reverse shoulder arthroplasty for definitive treatment of her left shoulder.  We again reviewed the risk of  bleeding, infection, damage to surrounding nerves and vessels, failure of pain relief, stiffness, and the risk of anesthesia.  She has provided informed consent.    Nicholes Stairs, MD Cell [phone]    09/19/2020 1:12 PM

## 2020-09-19 NOTE — Brief Op Note (Signed)
09/19/2020  4:33 PM  PATIENT:  Melissa [name]  70 y.o. female  PRE-OPERATIVE DIAGNOSIS:  Left shoulder rotator cuff arthropathy  POST-OPERATIVE DIAGNOSIS:   1. Left shoulder rotator cuff arthropathy 2.  Transfer of long head of biceps tendon to pectoralis major tendon  PROCEDURE:  Procedure(s) with comments: LEFT  REVERSE SHOULDER ARTHROPLASTY (Left) - 2.5 hrs  SURGEON:  Surgeon(s) and Role:    * Gavrielle Streck, Elly Modena, MD - Primary  PHYSICIAN ASSISTANT: Jonelle Sidle, PA-C  ANESTHESIA:   regional and general  EBL:  400 mL   BLOOD ADMINISTERED:none  DRAINS: none   LOCAL MEDICATIONS USED:  NONE  SPECIMEN:  No Specimen  DISPOSITION OF SPECIMEN:  N/A  COUNTS:  YES  TOURNIQUET:  * No tourniquets in log *  DICTATION: .Note written in EPIC  PLAN OF CARE: Discharge to home after PACU  PATIENT DISPOSITION:  PACU - hemodynamically stable.   Delay start of Pharmacological VTE agent (>24hrs) due to surgical blood loss or risk of bleeding: not applicable

## 2020-09-19 NOTE — Anesthesia Procedure Notes (Signed)
Procedure Name: Intubation Date/Time: 09/19/2020 2:21 PM Performed by: Lissa Morales, CRNA Pre-anesthesia Checklist: Patient identified, Emergency Drugs available, Suction available and Patient being monitored Patient Re-evaluated:Patient Re-evaluated prior to induction Oxygen Delivery Method: Circle system utilized Preoxygenation: Pre-oxygenation with 100% oxygen Induction Type: IV induction Ventilation: Mask ventilation without difficulty Laryngoscope Size: Mac and 4 Grade View: Grade I Tube type: Oral Tube size: 7.0 mm Number of attempts: 1 Airway Equipment and Method: Stylet and Oral airway Placement Confirmation: ETT inserted through vocal cords under direct vision,  positive ETCO2 and breath sounds checked- equal and bilateral Secured at: 21 cm Tube secured with: Tape Dental Injury: Teeth and Oropharynx as per pre-operative assessment

## 2020-09-19 NOTE — Evaluation (Signed)
Occupational Therapy Evaluation Patient Details Name: Melissa [name] MRN: [id number] DOB: 01-Nov-1950 Today's Date: 09/19/2020    History of Present Illness Presents today for left shoulder reverse athroplasty.   Clinical Impression   Mrs. Rie Mcneil is a 70 year old woman who presents today with planned left shoulder reverse arthroplasty. Therapist provided education and instruction to patient in regards to precautions/restrictions, positioning in bed and chair, donning upper extremity clothing, bathing, use of ice cuff and cooler and donning/doffing sling. Patient verbalized understanding. Provided with handout to maximize retention of information. Patient provided with tips on compensatory strategies to perform ADLs. Patient to follow up with MD for further therapy needs.      Follow Up Recommendations  Follow surgeon's recommendation for DC plan and follow-up therapies    Equipment Recommendations  None recommended by OT    Recommendations for Other Services       Precautions / Restrictions Precautions Precautions: Shoulder Type of Shoulder Precautions: Donning and doffing of sling   Restrictions: no ER over 30 degrees   No lifting over 2 lbs for first 6 weeks Shoulder Interventions: Shoulder sling/immobilizer;For comfort (sleep in sling at night) Precaution Booklet Issued:  (handout provided) Required Braces or Orthoses: Sling Restrictions Weight Bearing Restrictions: Yes LUE Weight Bearing: Non weight bearing      Mobility Bed Mobility                    Transfers                      Balance                                           ADL either performed or assessed with clinical judgement   ADL                                               Vision         Perception     Praxis      Pertinent Vitals/Pain Pain Assessment: Faces Faces Pain Scale: Hurts a little bit Pain Location: Left shoulder,  reports increases with use Pain Descriptors / Indicators: Aching Pain Intervention(s): Monitored during session     Hand Dominance     Extremity/Trunk Assessment Upper Extremity Assessment Upper Extremity Assessment: LUE deficits/detail LUE Deficits / Details: Grossly functonal ROM, decresaed shoulder ROM.           Communication     Cognition Arousal/Alertness: Awake/alert Behavior During Therapy: WFL for tasks assessed/performed Overall Cognitive Status: Within Functional Limits for tasks assessed                                     General Comments       Exercises     Shoulder Instructions Shoulder Instructions Donning/doffing shirt without moving shoulder: Patient able to independently direct caregiver Method for sponge bathing under operated UE: Patient able to independently direct caregiver Donning/doffing sling/immobilizer: Patient able to independently direct caregiver Correct positioning of sling/immobilizer: Patient able to independently direct caregiver Sling wearing schedule (on at all times/off for ADL's): Patient able to independently direct caregiver Proper positioning of operated  UE when showering: Patient able to independently direct caregiver Dressing change: Patient able to independently direct caregiver Positioning of UE while sleeping: Patient able to independently direct caregiver    Home Living                                          Prior Functioning/Environment                   OT Problem List: Decreased strength;Decreased range of motion;Pain      OT Treatment/Interventions:      OT Goals(Current goals can be found in the care plan section) Acute Rehab OT Goals OT Goal Formulation: All assessment and education complete, DC therapy  OT Frequency:     Barriers to D/C:            Co-evaluation              AM-PAC OT "6 Clicks" Daily Activity     Outcome Measure Help from another person  eating meals?: None Help from another person taking care of personal grooming?: None Help from another person toileting, which includes using toliet, bedpan, or urinal?: None Help from another person bathing (including washing, rinsing, drying)?: None Help from another person to put on and taking off regular upper body clothing?: None Help from another person to put on and taking off regular lower body clothing?: None 6 Click Score: 24   End of Session    Activity Tolerance: Patient tolerated treatment well Patient left: in bed;with call bell/phone within reach  OT Visit Diagnosis: Pain Pain - Right/Left: Left Pain - part of body: Shoulder                Time: 5621-3086 OT Time Calculation (min): 23 min Charges:  OT General Charges $OT Visit: 1 Visit OT Evaluation $OT Eval Low Complexity: 1 Low OT Treatments $Self Care/Home Management : 8-22 mins  Jakori Burkett, OTR/L Acute Care Rehab Services  Office [phone] Pager: [phone]   Lenward Chancellor 09/19/2020, 12:13 PM

## 2020-09-19 NOTE — Anesthesia Procedure Notes (Signed)
Anesthesia Regional Block: Interscalene brachial plexus block   Pre-Anesthetic Checklist: ,, timeout performed, Correct Patient, Correct Site, Correct Laterality, Correct Procedure, Correct Position, site marked, Risks and benefits discussed,  Surgical consent,  Pre-op evaluation,  At surgeon's request and post-op pain management  Laterality: Left  Prep: chloraprep       Needles:  Injection technique: Single-shot  Needle Type: Echogenic Needle     Needle Length: 9cm      Additional Needles:   Procedures:,,,, ultrasound used (permanent image in chart),,,,  Narrative:  Start time: 09/19/2020 1:26 PM End time: 09/19/2020 1:35 PM Injection made incrementally with aspirations every 5 mL.  Performed by: Personally  Anesthesiologist: Myrtie Soman, MD  Additional Notes: Patient tolerated the procedure well without complications

## 2020-09-19 NOTE — Anesthesia Postprocedure Evaluation (Signed)
Anesthesia Post Note  Patient: Melissa [name]  Procedure(s) Performed: LEFT  REVERSE SHOULDER ARTHROPLASTY (Left Shoulder)     Patient location during evaluation: PACU Anesthesia Type: General and Regional Level of consciousness: awake and alert Pain management: pain level controlled Vital Signs Assessment: post-procedure vital signs reviewed and stable Respiratory status: spontaneous breathing, nonlabored ventilation, respiratory function stable and patient connected to nasal cannula oxygen Cardiovascular status: blood pressure returned to baseline and stable Postop Assessment: no apparent nausea or vomiting Anesthetic complications: no   No complications documented.  Last Vitals:  Vitals:   09/19/20 1715 09/19/20 1814  BP: 124/81 132/85  Pulse: 72 86  Resp: 12 16  Temp:  36.8 C  SpO2: 95% 96%    Last Pain:  Vitals:   09/19/20 1700  TempSrc:   PainSc: 0-No pain                 Weslie Rasmus S

## 2020-09-19 NOTE — Transfer of Care (Signed)
Immediate Anesthesia Transfer of Care Note  Patient: Melissa [name]  Procedure(s) Performed: LEFT  REVERSE SHOULDER ARTHROPLASTY (Left Shoulder)  Patient Location: PACU  Anesthesia Type:GA combined with regional for post-op pain  Level of Consciousness: awake, alert , oriented and patient cooperative  Airway & Oxygen Therapy: Patient Spontanous Breathing and Patient connected to face mask oxygen  Post-op Assessment: Report given to RN and Post -op Vital signs reviewed and stable  Post vital signs: Reviewed and stable  Last Vitals:  Vitals Value Taken Time  BP 142/86 09/19/20 1645  Temp    Pulse 73 09/19/20 1646  Resp 16 09/19/20 1646  SpO2 100 % 09/19/20 1646  Vitals shown include unvalidated device data.  Last Pain:  Vitals:   09/19/20 1340  TempSrc:   PainSc: 0-No pain      Patients Stated Pain Goal: 4 (70/96/43 8381)  Complications: No complications documented.

## 2020-09-19 NOTE — Progress Notes (Signed)
Assisted Dr. Kalman Shan with left, ultrasound guided, interscalene  block. Side rails up, monitors on throughout procedure. See vital signs in flow sheet. Tolerated Procedure well.

## 2020-09-29 ENCOUNTER — Encounter (HOSPITAL_COMMUNITY): Payer: Self-pay | Admitting: Orthopedic Surgery

## 2020-10-03 DIAGNOSIS — M25511 Pain in right shoulder: Secondary | ICD-10-CM | POA: Diagnosis not present

## 2020-10-03 DIAGNOSIS — Z4789 Encounter for other orthopedic aftercare: Secondary | ICD-10-CM | POA: Diagnosis not present

## 2020-10-09 ENCOUNTER — Other Ambulatory Visit: Payer: Self-pay

## 2020-10-09 ENCOUNTER — Ambulatory Visit: Payer: PPO | Admitting: Physical Therapy

## 2020-10-13 ENCOUNTER — Other Ambulatory Visit: Payer: Self-pay

## 2020-10-13 ENCOUNTER — Ambulatory Visit: Payer: PPO | Attending: Orthopedic Surgery | Admitting: Physical Therapy

## 2020-10-13 ENCOUNTER — Encounter: Payer: Self-pay | Admitting: Physical Therapy

## 2020-10-13 DIAGNOSIS — M25512 Pain in left shoulder: Secondary | ICD-10-CM | POA: Insufficient documentation

## 2020-10-13 DIAGNOSIS — M25612 Stiffness of left shoulder, not elsewhere classified: Secondary | ICD-10-CM | POA: Insufficient documentation

## 2020-10-13 DIAGNOSIS — R600 Localized edema: Secondary | ICD-10-CM | POA: Diagnosis not present

## 2020-10-13 DIAGNOSIS — G8929 Other chronic pain: Secondary | ICD-10-CM | POA: Insufficient documentation

## 2020-10-13 NOTE — Therapy (Addendum)
[facility] [place], [place], [address] Phone: [phone]   Fax:  [phone]  Physical Therapy Evaluation  Patient Details  Name: Melissa [name] MRN: [id number] Date of Birth: 08-10-1950 Referring Provider (PT): Victorino December MD   Encounter Date: 10/13/2020   PT End of Session - 10/13/20 1254    Visit Number 1    Number of Visits 12    Date for PT Re-Evaluation 11/10/20    Authorization Type FOTO AT LEAST EVERY 5TH VISIT.  PROGRESS NOTE AT 10TH VISIT.  KX MODIFIER AFTER 15 VISITS.    PT Start Time 0815    PT Stop Time 0903    PT Time Calculation (min) 48 min    Activity Tolerance Patient tolerated treatment well    Behavior During Therapy [facility] for tasks assessed/performed           Past Medical History:  Diagnosis Date  . Anxiety   . Arthritis   . Cancer ([place])    skin Ca face,arms,back  . Chronic shoulder pain    h/o 3 surgeries on right shoulder. h/o 1 surgery on left shoulder  . Cirrhosis [facility])    sees Dr Loletha Grayer @ [place]. Gets MRI Q 6 mos  . Depression   . Hepatitis    sees Dr Loletha Grayer @ [facility]-has MRI Q 6 months    Past Surgical History:  Procedure Laterality Date  . ABDOMINAL HYSTERECTOMY  05/18/10   still has ovaries  . APPENDECTOMY    . CHOLECYSTECTOMY    . EYE SURGERY     catarac  . REVERSE SHOULDER ARTHROPLASTY Left 09/19/2020   Procedure: LEFT  REVERSE SHOULDER ARTHROPLASTY;  Surgeon: Nicholes Stairs, MD;  Location: [facility] ORS;  Service: Orthopedics;  Laterality: Left;  2.5 hrs  . shoulder sugery     3 times    There were no vitals filed for this visit.    Subjective Assessment - 10/13/20 1249    Subjective COVID-19 screen performed prior to patient entering clinic.  The patient presents to the clinic today s/p left reverse total shoulder performed on 09/19/20.  Her pain is a 6/10 though her pain was quite high last night as she he slept some on her left side.  She presented to the clinic today  without sling.  Rest helps decrease pain.    Pertinent History Multiple bilateral shoulder surgeries, arthritis.    Patient Stated Goals Use left UE without pain.    Currently in Pain? Yes    Pain Score 6     Pain Location Shoulder    Pain Orientation Left    Pain Descriptors / Indicators Aching    Pain Type Surgical pain    Pain Onset More than a month ago    Pain Frequency Constant    Aggravating Factors  Rest.    Pain Relieving Factors Lying on left side.              [facility] PT Assessment - 10/13/20 0001      Assessment   Medical Diagnosis Left reverse total shoulder replacement.    Referring Provider (PT) Victorino December MD    Onset Date/Surgical Date 09/19/20   09/19/20 (surgery date).     Precautions   Precaution Comments Follow [facility] reverse total shoulder replacement protocol.  No ultrasound.      Restrictions   Other Position/Activity Restrictions No UE weight bearing.      Balance Screen   Has the patient fallen  in the past 6 months No    Has the patient had a decrease in activity level because of a fear of falling?  No    Is the patient reluctant to leave their home because of a fear of falling?  No      Home Environment   Living Environment Private residence      Prior Function   Level of Independence Independent      Observation/Other Assessments   Observations Left shoulder incision appears to be healing well.      Observation/Other Assessments-Edema    Edema --   Minimal left shoulder edema.     ROM / Strength   AROM / PROM / Strength PROM      PROM   Overall PROM Comments In supine:  PAROM into flexion to 90 degrees and ER to 20 degrees.      Palpation   Palpation comment Tender to palpation over left shoulder incisional site.                      Objective measurements completed on examination: See above findings.       [facility] Adult PT Treatment/Exercise - 10/13/20 0001      Modalities   Modalities Vasopneumatic       Vasopneumatic   Number Minutes Vasopneumatic  15 minutes    Vasopnuematic Location  --   Left shoulder.  Pillow between thorax and left elbow.   Vasopneumatic Pressure Low                       PT Long Term Goals - 10/13/20 1318      PT LONG TERM GOAL #1   Title Independent with a HEP.    Time 4    Period Weeks    Status New      PT LONG TERM GOAL #2   Title Active left shoulder flexion to 145 degrees so the patient can easily reach overhead.    Time 4    Period Weeks    Status New      PT LONG TERM GOAL #3   Title Active ER to 70 degrees+ to allow for easily donning/doffing of apparel.    Time 4    Period Weeks    Status New      PT LONG TERM GOAL #4   Title Increase ROM so patient is able to reach behind back to L4.    Time 4    Period Weeks    Status New      PT LONG TERM GOAL #5   Title Increase left shoulder strength to a solid 4 to 4+/5 to increase stability for performance of functional activities.    Time 4    Period Weeks    Status New      PT LONG TERM GOAL #6   Title Perform ADL's with pain not > 3/10.    Time 4    Period Weeks    Status New                  Plan - 10/13/20 1255    Clinical Impression Statement The patient presents to OPPT s/p left reverse total shoulder replacement performed on 09/19/20.  As expected she has a loss of left shoulder range of motion and function.  Her incision appears to be healing well. Minimal left shoulder edema present. Her pain was increased today as she slept some on the left  shoulder last night.  Patient will benefit from skilled physical therapy intervention to address pain and deficits.    Personal Factors and Comorbidities Comorbidity 1;Comorbidity 2    Comorbidities Multiple bilateral shoulder surgeries, arthritis.    Examination-Activity Limitations Other;Reach Overhead    Examination-Participation Restrictions Other    Stability/Clinical Decision Making Stable/Uncomplicated    Clinical  Decision Making Low    Rehab Potential Excellent    PT Frequency 3x / week    PT Duration 4 weeks    PT Treatment/Interventions ADLs/Self Care Home Management;Cryotherapy;Electrical Stimulation;Moist Heat;Iontophoresis 4mg /ml Dexamethasone;Functional mobility training;Therapeutic activities;Therapeutic exercise;Neuromuscular re-education;Manual techniques;Patient/family education;Passive range of motion;Vasopneumatic Device    PT Next Visit Plan FOTO.  Proceed per [facility] reverse TSA protocol.  Vasopneumatic on low with pillow between left elbow and thorax.    Consulted and Agree with Plan of Care Patient           Patient will benefit from skilled therapeutic intervention in order to improve the following deficits and impairments:  Pain,Decreased activity tolerance,Decreased range of motion  Visit Diagnosis: Chronic left shoulder pain - Plan: PT plan of care cert/re-cert  Stiffness of left shoulder, not elsewhere classified - Plan: PT plan of care cert/re-cert  Localized edema - Plan: PT plan of care cert/re-cert     Problem List Patient Active Problem List   Diagnosis Date Noted  . Palpitations 04/26/2019  . Chronic right shoulder pain 02/19/2013  . Hepatitis 02/19/2013  . Cirrhosis ([place]) 02/19/2013  . Anxiety   . Depression     Arrayah Connors, [place] MPT 10/13/2020, 4:08 PM  [facility] [address] [place], [place], [address] Phone: [phone]   Fax:  [phone]  Name: Melissa [name] MRN: [id number] Date of Birth: Aug 18, 1950

## 2020-10-15 DIAGNOSIS — Z79899 Other long term (current) drug therapy: Secondary | ICD-10-CM | POA: Diagnosis not present

## 2020-10-15 DIAGNOSIS — Z131 Encounter for screening for diabetes mellitus: Secondary | ICD-10-CM | POA: Diagnosis not present

## 2020-10-15 DIAGNOSIS — B182 Chronic viral hepatitis C: Secondary | ICD-10-CM | POA: Diagnosis not present

## 2020-10-15 DIAGNOSIS — E538 Deficiency of other specified B group vitamins: Secondary | ICD-10-CM | POA: Diagnosis not present

## 2020-10-15 DIAGNOSIS — E559 Vitamin D deficiency, unspecified: Secondary | ICD-10-CM | POA: Diagnosis not present

## 2020-10-15 DIAGNOSIS — Z13220 Encounter for screening for lipoid disorders: Secondary | ICD-10-CM | POA: Diagnosis not present

## 2020-10-17 ENCOUNTER — Ambulatory Visit: Payer: PPO | Admitting: Physical Therapy

## 2020-10-27 ENCOUNTER — Ambulatory Visit: Payer: PPO | Admitting: Physical Therapy

## 2020-10-30 ENCOUNTER — Ambulatory Visit: Payer: PPO | Admitting: Physical Therapy

## 2020-10-30 ENCOUNTER — Other Ambulatory Visit: Payer: Self-pay

## 2020-10-30 DIAGNOSIS — M25512 Pain in left shoulder: Secondary | ICD-10-CM | POA: Diagnosis not present

## 2020-10-30 DIAGNOSIS — M25612 Stiffness of left shoulder, not elsewhere classified: Secondary | ICD-10-CM

## 2020-10-30 DIAGNOSIS — R600 Localized edema: Secondary | ICD-10-CM

## 2020-10-30 DIAGNOSIS — G8929 Other chronic pain: Secondary | ICD-10-CM

## 2020-10-30 NOTE — Therapy (Addendum)
[facility] [place], [place], [address] Phone: [phone]   Fax:  [phone]  Physical Therapy Treatment  Patient Details  Name: Melissa [name] MRN: [id number] Date of Birth: [birth year] Referring Provider (PT): Victorino December MD   Encounter Date: 10/30/2020   PT End of Session - 10/30/20 0808     Visit Number 2    Number of Visits 12    Date for PT Re-Evaluation 11/10/20    Authorization Type FOTO AT LEAST EVERY 5TH VISIT.  PROGRESS NOTE AT 10TH VISIT.  KX MODIFIER AFTER 15 VISITS.    PT Start Time 0731    PT Stop Time 0819    PT Time Calculation (min) 48 min    Activity Tolerance Patient tolerated treatment well    Behavior During Therapy WFL for tasks assessed/performed             Past Medical History:  Diagnosis Date   Anxiety    Arthritis    Cancer ([place])    skin Ca face,arms,back   Chronic shoulder pain    h/o 3 surgeries on right shoulder. h/o 1 surgery on left shoulder   Cirrhosis [facility])    sees Dr Loletha Grayer @ [facility]. Gets MRI Q 6 mos   Depression    Hepatitis    sees Dr Loletha Grayer @ [facility]-has MRI Q 6 months    Past Surgical History:  Procedure Laterality Date   ABDOMINAL HYSTERECTOMY  05/18/10   still has ovaries   APPENDECTOMY     CHOLECYSTECTOMY     EYE SURGERY     catarac   REVERSE SHOULDER ARTHROPLASTY Left 09/19/2020   Procedure: LEFT  REVERSE SHOULDER ARTHROPLASTY;  Surgeon: Nicholes Stairs, MD;  Location: [facility];  Service: Orthopedics;  Laterality: Left;  2.5 hrs   shoulder sugery     3 times    There were no vitals filed for this visit.   Subjective Assessment - 10/30/20 0732     Subjective COVID-19 screen performed prior to patient entering clinic. Patient arrived with no pain and slept on shoulder last night    Pertinent History Multiple bilateral shoulder surgeries, arthritis.    Patient Stated Goals Use left UE without pain.    Currently in Pain? No/denies                 [facility] PT Assessment - 10/30/20 0001       PROM   PROM Assessment Site Shoulder    Right/Left Shoulder Left    Left Shoulder Flexion 112 Degrees    Left Shoulder External Rotation 55 Degrees                           [facility] Adult PT Treatment/Exercise - 10/30/20 0001       Vasopneumatic   Number Minutes Vasopneumatic  15 minutes    Vasopnuematic Location  Shoulder    Vasopneumatic Pressure Low    Vasopneumatic Temperature  edema      Manual Therapy   Manual Therapy Soft tissue mobilization    Soft tissue mobilization manual P/AROM for left shoulder for all directions within protocol range                         PT Long Term Goals - 10/30/20 0809       PT LONG TERM GOAL #1   Title Independent with a HEP.    Time  4    Period Weeks    Status On-going      PT LONG TERM GOAL #2   Title Active left shoulder flexion to 145 degrees so the patient can easily reach overhead.    Baseline not using active at this time 10/30/20    Time 4    Period Weeks    Status On-going      PT LONG TERM GOAL #3   Title Active ER to 70 degrees+ to allow for easily donning/doffing of apparel.    Baseline not using active at this time 10/30/20    Time 4    Period Weeks    Status On-going      PT LONG TERM GOAL #4   Title Increase ROM so patient is able to reach behind back to L4.    Time 4    Period Weeks    Status On-going      PT LONG TERM GOAL #5   Title Increase left shoulder strength to a solid 4 to 4+/5 to increase stability for performance of functional activities.    Time 4    Period Weeks    Status On-going      PT LONG TERM GOAL #6   Title Perform ADL's with pain not > 3/10.    Time 4    Period Weeks    Status On-going                   Plan - 10/30/20 0810     Clinical Impression Statement Patient tolerated treatment well today. Today focused on P/AROM due to only second appt. To assess ROM and progress for current protocol.  Patients ROM withing current protocol and healing stage. Patient has been using left shoulder just with movements and minimal activity. Patient has reported no lifting and following MD orders to allow healing. Patient current goals progressing this week.    Personal Factors and Comorbidities Comorbidity 1;Comorbidity 2    Comorbidities Multiple bilateral shoulder surgeries, arthritis.    Examination-Activity Limitations Other;Reach Overhead    Examination-Participation Restrictions Other    Stability/Clinical Decision Making Stable/Uncomplicated    Rehab Potential Excellent    PT Frequency 3x / week    PT Duration 4 weeks    PT Treatment/Interventions ADLs/Self Care Home Management;Cryotherapy;Electrical Stimulation;Moist Heat;Iontophoresis 68m/ml Dexamethasone;Functional mobility training;Therapeutic activities;Therapeutic exercise;Neuromuscular re-education;Manual techniques;Patient/family education;Passive range of motion;Vasopneumatic Device    PT Next Visit Plan cont with POC for [facility] reverse TSA protocol.  Vasopneumatic on low with pillow between left elbow and thorax. ( 6 weeks 10/31/20)    Consulted and Agree with Plan of Care Patient             Patient will benefit from skilled therapeutic intervention in order to improve the following deficits and impairments:  Pain,Decreased activity tolerance,Decreased range of motion  Visit Diagnosis: Chronic left shoulder pain  Stiffness of left shoulder, not elsewhere classified  Localized edema     Problem List Patient Active Problem List   Diagnosis Date Noted   Palpitations 04/26/2019   Chronic right shoulder pain 02/19/2013   Hepatitis 02/19/2013   [place]osis ([place] 02/19/2013   Anxiety    Depression     CLadean Raya PTA 10/30/20 8:32 AM   [facility] [address] [place] [address]Phone: [phone]  Fax:  [phone] Name: Melissa STANSELLMRN:  [id number] of Birth: 9March 13, 1952 PHYSICAL THERAPY DISCHARGE SUMMARY  Visits from [facility]  Care: 2.  Current functional level related to goals / functional outcomes: See above.   Remaining deficits: See below.   Education / Equipment:    Patient agrees to discharge. Patient goals were not met. Patient is being discharged due to not returning since the last visit.    Mali Applegate MPT

## 2020-10-31 DIAGNOSIS — Z4789 Encounter for other orthopedic aftercare: Secondary | ICD-10-CM | POA: Diagnosis not present

## 2020-11-05 ENCOUNTER — Ambulatory Visit: Payer: PPO | Admitting: Physical Therapy

## 2020-11-06 ENCOUNTER — Encounter: Payer: PPO | Admitting: Physical Therapy

## 2020-12-12 DIAGNOSIS — Z4789 Encounter for other orthopedic aftercare: Secondary | ICD-10-CM | POA: Diagnosis not present

## 2021-03-06 IMAGING — CT CT SHOULDER*L* W/O CM
1 of 3 series · 7 of 14 positions shown, 9 images · non-contrast
Comparison: None.

CLINICAL DATA: Chronic left shoulder pain and limited range of
motion. Preop for total shoulder arthroplasty. Arthrex protocol.

EXAM:
CT OF THE UPPER LEFT EXTREMITY WITHOUT CONTRAST
TECHNIQUE: Multidetector CT imaging of the upper left extremity was performed
according to the standard protocol.

[Series 5: thin soft · axial · 0.46mm/px · z∈[-219,-71]mm · 7 of 329 slices shown, 9 images]
[im 42/329  soft-tissue]
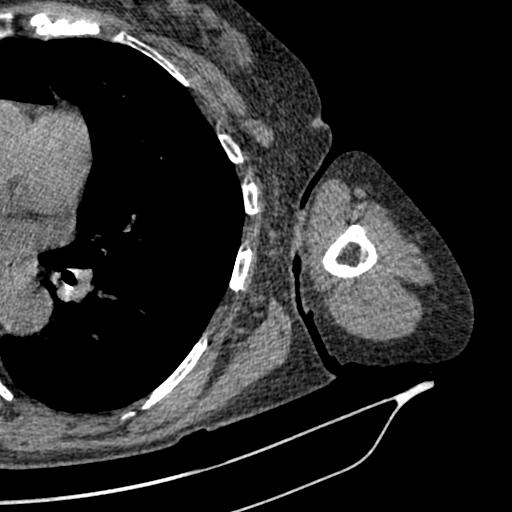
[im 42/329  bone]
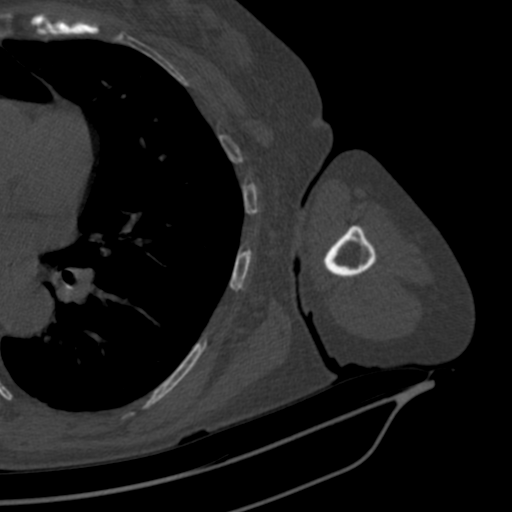
[im 83/329  bone]
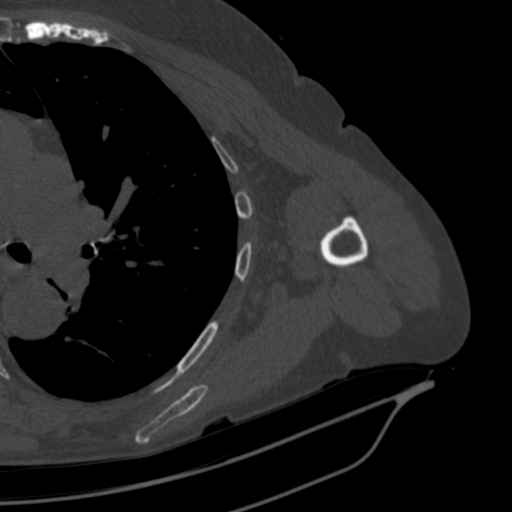
[im 124/329  bone]
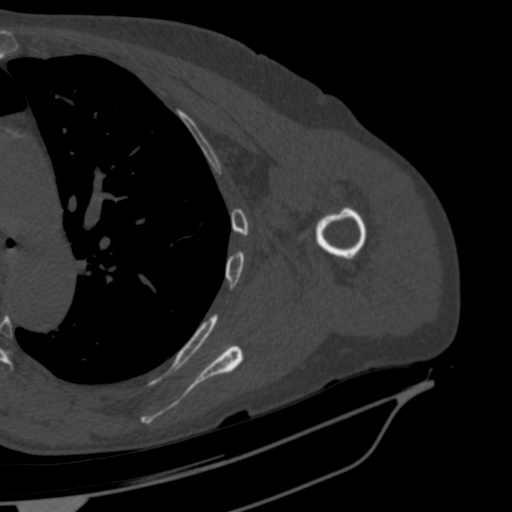
[im 165/329  bone]
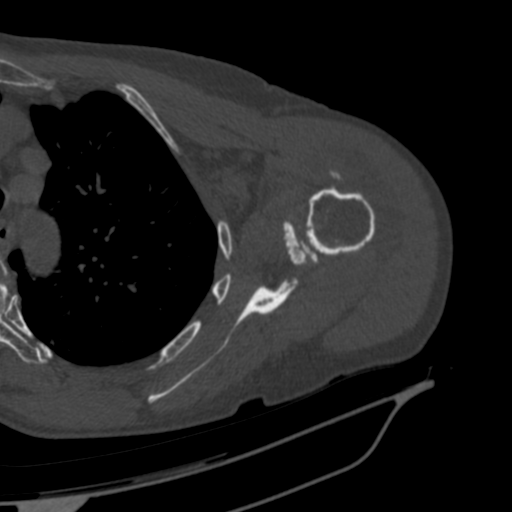
[im 206/329  soft-tissue]
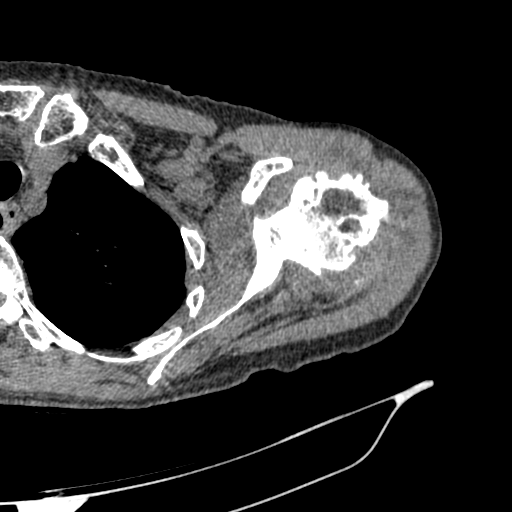
[im 206/329  bone]
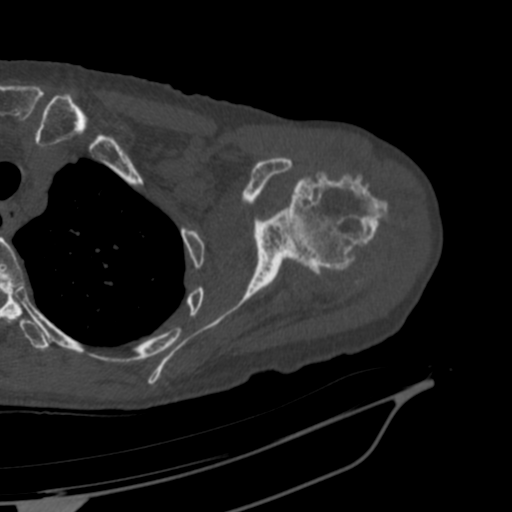
[im 247/329  bone]
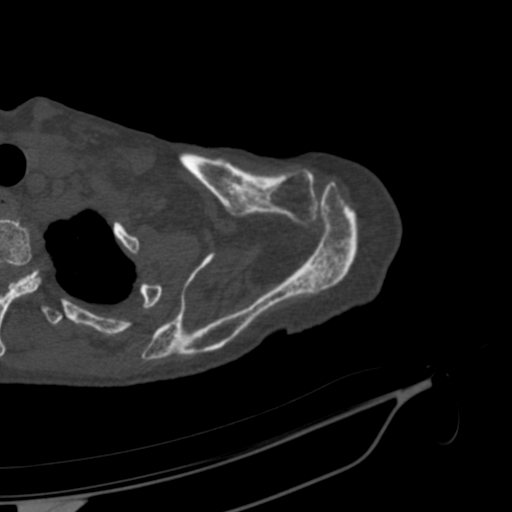
[im 288/329  bone]
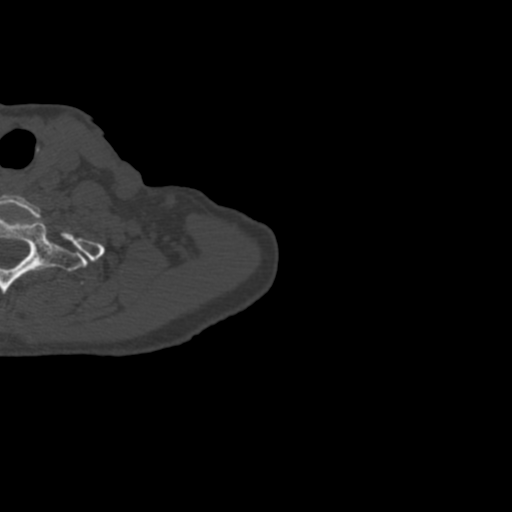

[7 of 14 positions shown; findings below may reference images not displayed]

FINDINGS: Severe/advanced glenohumeral joint degenerative changes with
complete loss of the joint space with bone-on-bone appearance. There
is significant osteophytic spurring, subchondral cystic change and
bony eburnation. No AVN. The glenoid is mildly widened and thinned.

Moderate AC joint degenerative changes are noted. Suspect prior
acromioplasty changes with moderate subacromial spurring changes.
Significant narrowing of the humeroacromial space and superior
subluxation of the humeral head consistent with a full-thickness
rotator cuff tear. The infraspinatus and supraspinatus to muscles
demonstrate fatty atrophy.

The visualized left ribs are intact and the visualized left lung is
grossly clear. No worrisome pulmonary lesions.
IMPRESSION: 1. Severe/advanced glenohumeral joint degenerative changes.
2. Significant narrowing of the humeroacromial space and superior
subluxation of the humeral head consistent with a full-thickness
rotator cuff tear.
3. Fatty atrophy of the supraspinatus and infraspinatus muscles.
4. Moderate AC joint degenerative changes.
5. Suspect prior acromioplasty changes with moderate subacromial
spurring changes.

## 2021-04-18 IMAGING — DX DG SHOULDER 1V*L*
1 series · 1 of 1 positions shown · non-contrast
Comparison: CT 08/07/2020

CLINICAL DATA: Shoulder replacement

EXAM:
LEFT SHOULDER

[shoulder ap]
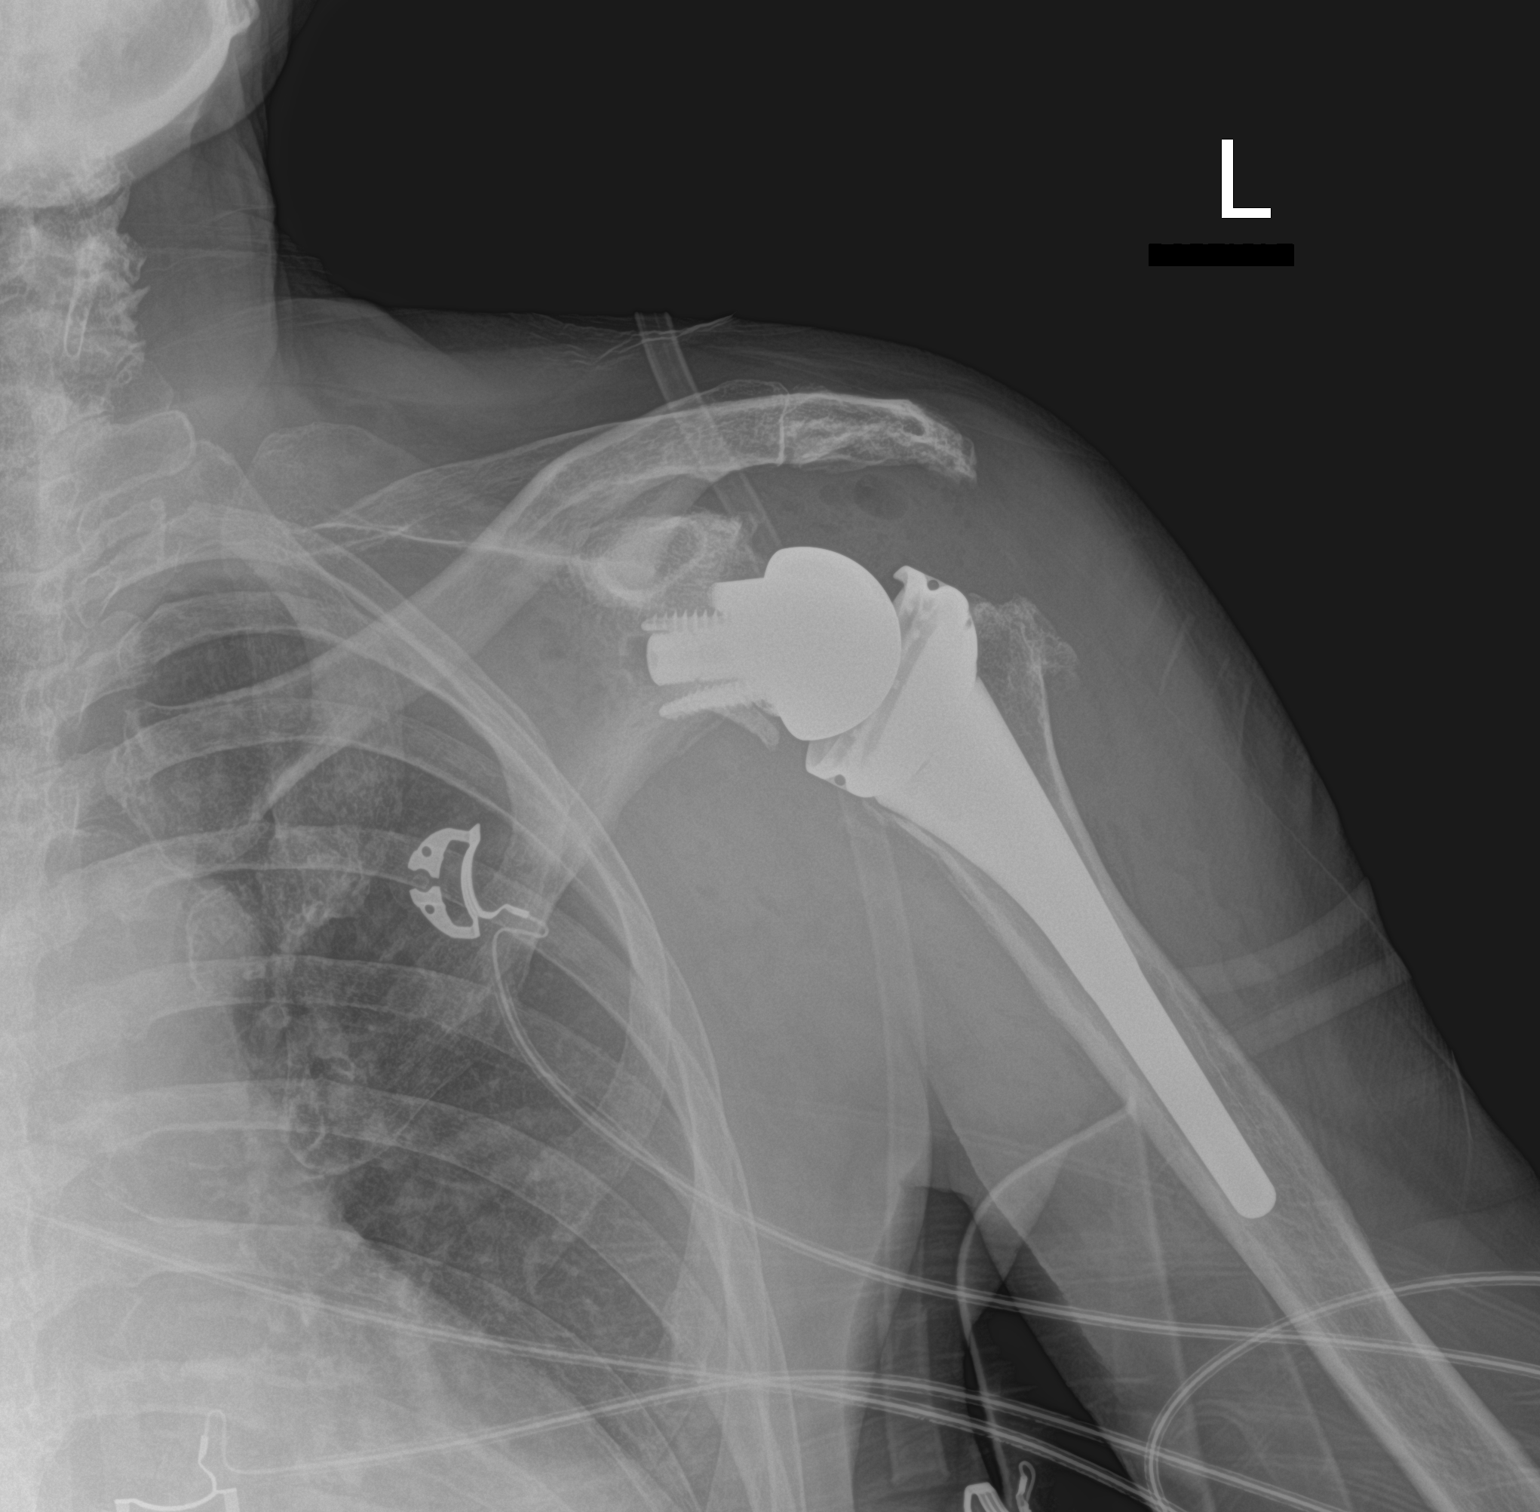

[1 of 1 positions shown; findings below may reference images not displayed]

FINDINGS: Status post reverse left shoulder replacement with normal alignment
and intact hardware. No fracture is seen. AC joint degenerative
change. Gas within the soft tissues consistent with recent surgery.
IMPRESSION: Status post left shoulder replacement with expected postsurgical
changes.

## 2022-05-12 ENCOUNTER — Other Ambulatory Visit: Payer: Self-pay | Admitting: Family Medicine

## 2022-06-23 ENCOUNTER — Other Ambulatory Visit: Payer: Self-pay | Admitting: Family Medicine

## 2022-06-23 DIAGNOSIS — N644 Mastodynia: Secondary | ICD-10-CM

## 2022-09-02 ENCOUNTER — Ambulatory Visit
Admission: RE | Admit: 2022-09-02 | Discharge: 2022-09-02 | Disposition: A | Discharge status: Home / Self Care | Payer: PPO | Source: Ambulatory Visit | Attending: Family Medicine | Admitting: Family Medicine

## 2022-09-02 ENCOUNTER — Ambulatory Visit: Payer: PPO

## 2022-09-02 DIAGNOSIS — N644 Mastodynia: Secondary | ICD-10-CM

## 2022-09-07 ENCOUNTER — Telehealth: Payer: Self-pay | Admitting: Genetic Counselor

## 2022-09-07 NOTE — Telephone Encounter (Signed)
Scheduled appt per 2/5 referral. Pt is aware of appt date and time. Pt is aware to arrive 15 mins prior to appt time and to bring and updated insurance card. Pt is aware of appt location.   

## 2022-11-03 ENCOUNTER — Inpatient Hospital Stay: Payer: PPO | Attending: Genetic Counselor | Admitting: Genetic Counselor

## 2022-11-03 ENCOUNTER — Inpatient Hospital Stay: Payer: PPO

## 2023-08-03 HISTORY — PX: KNEE ARTHROPLASTY: SHX992

## 2024-02-22 ENCOUNTER — Other Ambulatory Visit: Payer: Self-pay | Admitting: Family Medicine

## 2024-02-22 DIAGNOSIS — Z1231 Encounter for screening mammogram for malignant neoplasm of breast: Secondary | ICD-10-CM

## 2024-02-29 ENCOUNTER — Ambulatory Visit

## 2024-03-15 ENCOUNTER — Ambulatory Visit
Admission: RE | Admit: 2024-03-15 | Discharge: 2024-03-15 | Disposition: A | Discharge status: Home / Self Care | Source: Ambulatory Visit | Attending: Family Medicine | Admitting: Family Medicine

## 2024-03-15 DIAGNOSIS — Z1231 Encounter for screening mammogram for malignant neoplasm of breast: Secondary | ICD-10-CM

## 2024-07-16 ENCOUNTER — Ambulatory Visit

## 2024-07-16 ENCOUNTER — Ambulatory Visit: Attending: Nurse Practitioner | Admitting: Nurse Practitioner

## 2024-07-16 VITALS — BP 140/80 | HR 88 | Ht 67.0 in | Wt 126.0 lb

## 2024-07-16 DIAGNOSIS — R002 Palpitations: Secondary | ICD-10-CM

## 2024-07-16 NOTE — Progress Notes (Unsigned)
 Enrolled for [employer] to mail a ZIO XT long term holter monitor to the patients address on file.   Dr. Odis Hollingshead to read.

## 2024-07-16 NOTE — Progress Notes (Unsigned)
 Office Visit    Patient Name: Melissa [name] Date of Encounter: 07/16/2024  Primary Care Provider:  Patient, No Pcp Per Primary Cardiologist:  Madonna Large, DO  Chief Complaint    73 year old female with a history of palpitations, SVT, mitral valve regurgitation, hepatitis C, cirrhosis, arthritis, chronic shoulder pain, skin cancer, anxiety, and depression who presents for heart first clinic new patient evaluation.  Past Medical History    Past Medical History:  Diagnosis Date   Anxiety    Arthritis    Cancer ([facility])    skin Ca face,arms,back   Chronic shoulder pain    h/o 3 surgeries on right shoulder. h/o 1 surgery on left shoulder   Cirrhosis [facility])    sees Dr Nola @ [facility]. Gets MRI Q 6 mos   Depression    Hepatitis    sees Dr Nola @ [facility]-has MRI Q 6 months   Past Surgical History:  Procedure Laterality Date   ABDOMINAL HYSTERECTOMY  05/18/10   still has ovaries   APPENDECTOMY     CHOLECYSTECTOMY     EYE SURGERY     catarac   REVERSE SHOULDER ARTHROPLASTY Left 09/19/2020   Procedure: LEFT  REVERSE SHOULDER ARTHROPLASTY;  Surgeon: Sharl Selinda Dover, MD;  Location: [facility];  Service: Orthopedics;  Laterality: Left;  2.5 hrs   shoulder sugery     3 times    Allergies  Allergies[1]   Labs/Other Studies Reviewed    The following studies were reviewed today:  Cardiac Studies & Procedures   ______________________________________________________________________________________________     ECHOCARDIOGRAM  ECHOCARDIOGRAM COMPLETE 05/03/2019  Narrative ECHOCARDIOGRAM REPORT    Patient Name:   Melissa [name] Date of Exam: 05/03/2019 Medical Rec #:  [id number]      Height:       67.0 in Accession #:    [id number]     Weight:       148.5 lb Date of Birth:  November 23, 1950      BSA:          1.78 m Patient Age:    68 years       BP:           110/80 mmHg Patient Gender: F              HR:           80 bpm. Exam Location:  [address]  Procedure: 2D Echo,  Cardiac Doppler and Color Doppler  Indications:    R00.2 Palpitations  History:        Patient has no prior history of Echocardiogram examinations.  Sonographer:    Carl Rodgers-Jones RDCS Referring Phys: [id number] CHRISTOPHER L SCHUMANN  IMPRESSIONS   1. Left ventricular ejection fraction, by visual estimation, is 60 to 65%. The left ventricle has normal function. Normal left ventricular size. Left ventricular septal wall thickness was mildly increased. Mildly increased left ventricular posterior wall thickness. There is no left ventricular hypertrophy. 2. Left ventricular diastolic Doppler parameters are consistent with impaired relaxation pattern of LV diastolic filling. 3. Global right ventricle has normal systolic function.The right ventricular size is normal. No increase in right ventricular wall thickness. 4. Left atrial size was normal. 5. Right atrial size was normal. 6. The mitral valve is normal in structure. Mild mitral valve regurgitation. No evidence of mitral stenosis. 7. The tricuspid valve is normal in structure. Tricuspid valve regurgitation is mild. 8. The aortic valve is tricuspid Aortic valve regurgitation was not visualized by color  flow Doppler. Mild aortic valve sclerosis without stenosis. 9. The pulmonic valve was normal in structure. Pulmonic valve regurgitation is not visualized by color flow Doppler. 10. Normal pulmonary artery systolic pressure. 11. The inferior vena cava is normal in size with greater than 50% respiratory variability, suggesting right atrial pressure of 3 mmHg.  FINDINGS Left Ventricle: Left ventricular ejection fraction, by visual estimation, is 60 to 65%. The left ventricle has normal function. Left ventricular septal wall thickness was mildly increased. Mildly increased left ventricular posterior wall thickness. There is no left ventricular hypertrophy. Normal left ventricular size. Spectral Doppler shows Left ventricular diastolic  Doppler parameters are consistent with impaired relaxation pattern of LV diastolic filling. Normal left ventricular filling pressures.  Right Ventricle: The right ventricular size is normal. No increase in right ventricular wall thickness. Global RV systolic function is has normal systolic function. The tricuspid regurgitant velocity is 1.70 m/s, and with an assumed right atrial pressure of 3 mmHg, the estimated right ventricular systolic pressure is normal at 14.5 mmHg.  Left Atrium: Left atrial size was normal in size.  Right Atrium: Right atrial size was normal in size  Pericardium: There is no evidence of pericardial effusion.  Mitral Valve: The mitral valve is normal in structure. No evidence of mitral valve stenosis by observation. Mild mitral valve regurgitation.  Tricuspid Valve: The tricuspid valve is normal in structure. Tricuspid valve regurgitation is mild by color flow Doppler.  Aortic Valve: The aortic valve is tricuspid. Aortic valve regurgitation was not visualized by color flow Doppler. Mild aortic valve sclerosis is present, with no evidence of aortic valve stenosis.  Pulmonic Valve: The pulmonic valve was normal in structure. Pulmonic valve regurgitation is not visualized by color flow Doppler.  Aorta: The aortic root, ascending aorta and aortic arch are all structurally normal, with no evidence of dilitation or obstruction.  Venous: The inferior vena cava is normal in size with greater than 50% respiratory variability, suggesting right atrial pressure of 3 mmHg.  IAS/Shunts: No atrial level shunt detected by color flow Doppler. No ventricular septal defect is seen or detected. There is no evidence of an atrial septal defect.    LEFT VENTRICLE PLAX 2D LVIDd:         4.00 cm  Diastology LVIDs:         2.40 cm  LV e' lateral:   5.77 cm/s LV PW:         1.20 cm  LV E/e' lateral: 7.2 LV IVS:        1.20 cm  LV e' medial:    4.57 cm/s LVOT diam:     2.10 cm  LV E/e'  medial:  9.1 LV SV:         50 ml LV SV Index:   27.86 LVOT Area:     3.46 cm   RIGHT VENTRICLE RV Basal diam:  3.80 cm RV S prime:     12.30 cm/s TAPSE (M-mode): 2.1 cm  LEFT ATRIUM           Index       RIGHT ATRIUM          Index LA diam:      4.10 cm 2.30 cm/m  RA Area:     9.38 cm LA Vol (A2C): 40.5 ml 22.73 ml/m RA Volume:   19.90 ml 11.17 ml/m LA Vol (A4C): 22.6 ml 12.68 ml/m AORTIC VALVE LVOT Vmax:   74.20 cm/s LVOT Vmean:  58.050 cm/s LVOT VTI:  0.153 m  AORTA Ao Root diam: 3.50 cm  MITRAL VALVE                        TRICUSPID VALVE MV Area (PHT): 3.12 cm             TR Peak grad:   11.5 mmHg MV PHT:        70.47 msec           TR Vmax:        186.00 cm/s MV Decel Time: 243 msec MV E velocity: 41.60 cm/s 103 cm/s  SHUNTS MV A velocity: 81.40 cm/s 70.3 cm/s Systemic VTI:  0.15 m MV E/A ratio:  0.51       1.5       Systemic Diam: 2.10 cm   Wilbert Bihari MD Electronically signed by Wilbert Bihari MD Signature Date/Time: 05/03/2019/10:26:33 AM    Final    MONITORS  LONG TERM MONITOR (3-14 DAYS) 05/24/2019  Narrative  No significant arrhythmias detected  6 days of data recorded on Zio monitor. Patient had a min HR of 56 bpm, max HR of 141 bpm, and avg HR of 77 bpm. Predominant underlying rhythm was Sinus Rhythm. No VT, atrial fibrillation, high degree block, or pauses noted. Isolated atrial and ventricular ectopy was rare (<1%). One 11 beat run of SVT.  There were 12 triggered events, corresponding to sinus rhtyhm and rare PVCs.  No significant arrhythmias detected.       ______________________________________________________________________________________________     Recent Labs: 07/16/2024: ALT 27; BUN 21; Creatinine, Ser 0.73; Hemoglobin 14.9; Magnesium 2.0; Platelets 124; Potassium 4.5; Sodium 137; TSH 3.190  Recent Lipid Panel No results found for: CHOL, TRIG, HDL, CHOLHDL, VLDL, LDLCALC, LDLDIRECT  History of Present Illness     73 year old female with the above past medical history including palpitations, SVT, mitral valve regurgitation, hepatitis C, cirrhosis, arthritis, chronic shoulder pain, skin cancer, anxiety, and depression.  Previously evaluated by Dr. Kate in the setting of palpitations, last seen 04/2019.  Echocardiogram in 05/22/2019 showed EF 60 to 65%, normal LV function, no RWMA, mild mitral valve regurgitation. Cardiac monitor in 05/2019 showed predominantly normal sinus rhythm, 11 beat run of SVT, rare PACs and PVCs, no significant arrhythmia. She saw her PCP on 06/25/2024 and reported intermittent palpitations.  She was referred to cardiology for further evaluation.  She presents today for heart first clinic new patient evaluation accompanied by her husband.  She reports a 71-month history of intermittent palpitations, which she describes as her heart beating out of my chest.  Symptoms occur 2-3 times a week, mostly at night, last for 1 to 2 minutes and resolve spontaneously. She denies any associated symptoms. Denies chest pain, dizziness, presyncope or syncope.  She exercises regularly, she goes to gym 3 times a week and does accommodation of cardio and weights. She drinks 3 cups of coffee a day.  Takes a variety of over-the-counter supplements.  Of note, she is pending possible knee surgery. Additionally, patient states she did not wear a heart monitor previously and she is unclear as to how there are results available for review.    Home Medications    Current Outpatient Medications  Medication Sig Dispense Refill   buPROPion  (WELLBUTRIN  XL) 150 MG 24 hr tablet Take 150 mg by mouth every morning.     Cholecalciferol (VITAMIN D) 50 MCG (2000 UT) tablet Take 2,000 Units by mouth daily.     estradiol (ESTRACE) 0.5  MG tablet Take 0.5 mg by mouth daily.     albuterol (VENTOLIN HFA) 108 (90 Base) MCG/ACT inhaler Inhale 2 puffs into the lungs every 6 (six) hours as needed for wheezing or shortness of  breath.     ALPRAZolam (XANAX) 1 MG tablet Take 1 mg by mouth 3 (three) times daily as needed for anxiety.     GLUTATHIONE PO Take 1 each by mouth daily. Gel     methocarbamol  (ROBAXIN ) 500 MG tablet Take 1 tablet (500 mg total) by mouth every 6 (six) hours as needed for muscle spasms. 50 tablet 0   Oxycodone  HCl 10 MG TABS Take 10 mg by mouth 4 (four) times daily as needed.     vitamin C (ASCORBIC ACID) 500 MG tablet Take 500 mg by mouth daily.     No current facility-administered medications for this visit.     Review of Systems   She denies chest pain, dyspnea, pnd, orthopnea, n, v, dizziness, syncope, edema, weight gain, or early satiety. All other systems reviewed and are otherwise negative except as noted above.   Physical Exam    VS:  BP (!) 140/80   Pulse 88   Ht 5' 7 (1.702 m)   Wt 126 lb (57.2 kg)   SpO2 97%   BMI 19.73 kg/m  GEN: Well nourished, well developed, in no acute distress. HEENT: normal. Neck: Supple, no JVD, carotid bruits, or masses. Cardiac: RRR, no murmurs, rubs, or gallops. No clubbing, cyanosis, edema.  Radials/DP/PT 2+ and equal bilaterally.  Respiratory:  Respirations regular and unlabored, clear to auscultation bilaterally. GI: Soft, nontender, nondistended, BS + x 4. MS: no deformity or atrophy. Skin: warm and dry, no rash. Neuro:  Strength and sensation are intact. Psych: Normal affect.  Accessory Clinical Findings    ECG personally reviewed by me today - EKG Interpretation Date/Time:  Monday July 16 2024 10:54:44 EST Ventricular Rate:  88 PR Interval:  176 QRS Duration:  80 QT Interval:  350 QTC Calculation: 423 R Axis:   20  Text Interpretation: Normal sinus rhythm Normal ECG No previous ECGs available Confirmed by Daneen Perkins ([id number]) on 07/16/2024 11:15:43 AM  - no acute changes.   Lab Results  Component Value Date   WBC 5.7 07/16/2024   HGB 14.9 07/16/2024   HCT 43.9 07/16/2024   MCV 93 07/16/2024   PLT 124 (L) 07/16/2024    Lab Results  Component Value Date   CREATININE 0.73 07/16/2024   BUN 21 07/16/2024   NA 137 07/16/2024   K 4.5 07/16/2024   CL 100 07/16/2024   CO2 24 07/16/2024   Lab Results  Component Value Date   ALT 27 07/16/2024   AST 21 07/16/2024   ALKPHOS 71 07/16/2024   BILITOT 0.9 07/16/2024   No results found for: CHOL, HDL, LDLCALC, LDLDIRECT, TRIG, CHOLHDL  No results found for: HGBA1C  Assessment & Plan    1. Palpitations/SVT: Cardiac monitor in 05/2019 showed predominantly normal sinus rhythm, 11 beat run of SVT, rare PACs and PVCs, no significant arrhythmia (states she did not wear a heart monitor previously). She reports a 71-month history of intermittent palpitations, which she describes as her heart beating out of my chest.  Symptoms occur 2-3 times a week, mostly at night, last for 1 to 2 minutes and resolve spontaneously. She denies any associated symptoms. Will check TSH, magnesium, CBC, CMET.  Will check 14-day ZIO monitor in the setting of palpitations.  Will update echocardiogram. Reviewed  ED precautions, vagal maneuvers.  2. Mitral valve regurgitation: Echocardiogram in 05/22/2019 showed EF 60 to 65%, normal LV function, no RWMA, mild mitral valve regurgitation.  Asymptomatic, euvolemic and well compensated on exam.  Repeat echo pending as above.  3. Elevated BP reading: BP mildly elevated in office today, generally well-controlled.  Continue to monitor BP and report because is greater than 130/80 mmHg.  4. Preoperative cardiac evaluation: Patient states she is pending knee surgery.  She is able to complete greater than 4 METS at baseline.  Doing monitor, echo as above, however, testing should not preclude surgery. Based on ACC/AHA guidelines, patient would be at acceptable risk for the planned procedure without further cardiovascular testing.   5. Disposition: Follow-up in 2 months with Dr. Michele.    HYPERTENSION CONTROL Vitals:   07/16/24 1052 07/16/24  1120  BP: (!) 160/100 (!) 140/80    The patient's blood pressure is elevated above target today.  In order to address the patient's elevated BP: Blood pressure will be monitored at home to determine if medication changes need to be made.; Follow up with general cardiology has been recommended.      Damien JAYSON Braver, NP 07/18/2024, 7:51 PM       [1] No Known Allergies

## 2024-07-16 NOTE — Patient Instructions (Signed)
 Medication Instructions:  Your physician recommends that you continue on your current medications as directed. Please refer to the Current Medication list given to you today.  *If you need a refill on your cardiac medications before your next appointment, please call your pharmacy*  Lab Work: TODAY:  CMET, CBC, MAG, & TSH  If you have labs (blood work) drawn today and your tests are completely normal, you will receive your results only by: MyChart Message (if you have MyChart) OR A paper copy in the mail If you have any lab test that is abnormal or we need to change your treatment, we will call you to review the results.  Testing/Procedures: Your physician has requested that you have an echocardiogram. Echocardiography is a painless test that uses sound waves to create images of your heart. It provides your doctor with information about the size and shape of your heart and how well your hearts chambers and valves are working. This procedure takes approximately one hour. There are no restrictions for this procedure. Please do NOT wear cologne, perfume, aftershave, or lotions (deodorant is allowed). Please arrive 15 minutes prior to your appointment time.  Please note: We ask at that you not bring children with you during ultrasound (echo/ vascular) testing. Due to room size and safety concerns, children are not allowed in the ultrasound rooms during exams. Our front office staff cannot provide observation of children in our lobby area while testing is being conducted. An adult accompanying a patient to their appointment will only be allowed in the ultrasound room at the discretion of the ultrasound technician under special circumstances. We apologize for any inconvenience.   ZIO XT- Long Term Monitor Instructions  Your physician has requested you wear a ZIO patch monitor for 14 DAYS.  This is a single patch monitor. [employer] supplies one patch monitor per enrollment. Additional stickers are  not available. Please do not apply patch if you will be having a Nuclear Stress Test,  Echocardiogram, Cardiac CT, MRI, or Chest Xray during the period you would be wearing the  monitor. The patch cannot be worn during these tests. You cannot remove and re-apply the  ZIO XT patch monitor.  Your ZIO patch monitor will be mailed 3 day USPS to your address on file. It may take 3-5 days  to receive your monitor after you have been enrolled.  Once you have received your monitor, please review the enclosed instructions. Your monitor  has already been registered assigning a specific monitor serial # to you.  Billing and Patient Assistance Program Information  We have supplied [employer] with any of your insurance information on file for billing purposes. [employer] offers a sliding scale Patient Assistance Program for patients that do not have  insurance, or whose insurance does not completely cover the cost of the ZIO monitor.  You must apply for the Patient Assistance Program to qualify for this discounted rate.  To apply, please call [employer] at [phone], select option 4, select option 2, ask to apply for  Patient Assistance Program. Meredeth will ask your household income, and how many people  are in your household. They will quote your out-of-pocket cost based on that information.  [employer] will also be able to set up a 71-month, interest-free payment plan if needed.  Applying the monitor   Shave hair from upper left chest.  Hold abrader disc by orange tab. Rub abrader in 40 strokes over the upper left chest as  indicated in your monitor instructions.  Clean  area with 4 enclosed alcohol pads. Let dry.  Apply patch as indicated in monitor instructions. Patch will be placed under collarbone on left  side of chest with arrow pointing upward.  Rub patch adhesive wings for 2 minutes. Remove white label marked 1. Remove the white  label marked 2. Rub patch adhesive wings for 2 additional minutes.   While looking in a mirror, press and release button in center of patch. A small green light will  flash 3-4 times. This will be your only indicator that the monitor has been turned on.  Do not shower for the first 24 hours. You may shower after the first 24 hours.  Press the button if you feel a symptom. You will hear a small click. Record Date, Time and  Symptom in the Patient Logbook.  When you are ready to remove the patch, follow instructions on the last 2 pages of Patient  Logbook. Stick patch monitor onto the last page of Patient Logbook.  Place Patient Logbook in the blue and white box. Use locking tab on box and tape box closed  securely. The blue and white box has prepaid postage on it. Please place it in the mailbox as  soon as possible. Your physician should have your test results approximately 7 days after the  monitor has been mailed back to [facility].  Call [facility] Customer Care at [phone] if you have questions regarding  your ZIO XT patch monitor. Call them immediately if you see an orange light blinking on your  monitor.  If your monitor falls off in less than 4 days, contact our Monitor department at [phone].  If your monitor becomes loose or falls off after 4 days call [employer] at [phone] for  suggestions on securing your monitor   Follow-Up: At [facility], you and your health needs are our priority.  As part of our continuing mission to provide you with exceptional heart care, our providers are all part of one team.  This team includes your primary Cardiologist (physician) and Advanced Practice Providers or APPs (Physician Assistants and Nurse Practitioners) who all work together to provide you with the care you need, when you need it.  Your next appointment:   2 month(s)  Provider:   Madonna Large, DO    We recommend signing up for the patient portal called MyChart.  Sign up information is provided on this After Visit Summary.   MyChart is used to connect with patients for Virtual Visits (Telemedicine).  Patients are able to view lab/test results, encounter notes, upcoming appointments, etc.  Non-urgent messages can be sent to your provider as well.   To learn more about what you can do with MyChart, go to [email or web address].   Other Instructions

## 2024-07-17 LAB — COMPREHENSIVE METABOLIC PANEL WITH GFR
ALT: 27 IU/L (ref 0–32)
AST: 21 IU/L (ref 0–40)
Albumin: 4.6 g/dL (ref 3.8–4.8)
Alkaline Phosphatase: 71 IU/L (ref 49–135)
BUN/Creatinine Ratio: 29 — ABNORMAL HIGH (ref 12–28)
BUN: 21 mg/dL (ref 8–27)
Bilirubin Total: 0.9 mg/dL (ref 0.0–1.2)
CO2: 24 mmol/L (ref 20–29)
Calcium: 9.3 mg/dL (ref 8.7–10.3)
Chloride: 100 mmol/L (ref 96–106)
Creatinine, Ser: 0.73 mg/dL (ref 0.57–1.00)
Globulin, Total: 2.4 g/dL (ref 1.5–4.5)
Glucose: 92 mg/dL (ref 70–99)
Potassium: 4.5 mmol/L (ref 3.5–5.2)
Sodium: 137 mmol/L (ref 134–144)
Total Protein: 7 g/dL (ref 6.0–8.5)
eGFR: 88 mL/min/1.73 (ref >59)

## 2024-07-17 LAB — CBC
Hematocrit: 43.9 % (ref 34.0–46.6)
Hemoglobin: 14.9 g/dL (ref 11.1–15.9)
MCH: 31.7 pg (ref 26.6–33.0)
MCHC: 33.9 g/dL (ref 31.5–35.7)
MCV: 93 fL (ref 79–97)
Platelets: 124 x10E3/uL — ABNORMAL LOW (ref 150–450)
RBC: 4.7 x10E6/uL (ref 3.77–5.28)
RDW: 12.6 % (ref 11.7–15.4)
WBC: 5.7 x10E3/uL (ref 3.4–10.8)

## 2024-07-17 LAB — TSH: TSH: 3.19 u[IU]/mL (ref 0.450–4.500)

## 2024-07-17 LAB — MAGNESIUM: Magnesium: 2 mg/dL (ref 1.6–2.3)

## 2024-07-18 ENCOUNTER — Emergency Department (HOSPITAL_COMMUNITY)
Admission: EM | Admit: 2024-07-18 | Discharge: 2024-07-18 | Disposition: A | Discharge status: Home / Self Care | Attending: Emergency Medicine | Admitting: Emergency Medicine

## 2024-07-18 ENCOUNTER — Encounter (HOSPITAL_COMMUNITY): Payer: Self-pay

## 2024-07-18 ENCOUNTER — Encounter: Payer: Self-pay | Admitting: Nurse Practitioner

## 2024-07-18 DIAGNOSIS — Y92410 Unspecified street and highway as the place of occurrence of the external cause: Secondary | ICD-10-CM | POA: Insufficient documentation

## 2024-07-18 DIAGNOSIS — M546 Pain in thoracic spine: Secondary | ICD-10-CM | POA: Diagnosis present
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
## 2024-07-20 ENCOUNTER — Other Ambulatory Visit: Payer: Self-pay | Admitting: Orthopedic Surgery

## 2024-07-20 DIAGNOSIS — Z96612 Presence of left artificial shoulder joint: Secondary | ICD-10-CM

## 2024-07-23 ENCOUNTER — Other Ambulatory Visit

## 2024-07-23 ENCOUNTER — Ambulatory Visit: Payer: Self-pay | Admitting: Nurse Practitioner

## 2024-07-23 ENCOUNTER — Ambulatory Visit
Admission: RE | Admit: 2024-07-23 | Discharge: 2024-07-23 | Disposition: A | Source: Ambulatory Visit | Attending: Orthopedic Surgery

## 2024-07-23 DIAGNOSIS — Z96612 Presence of left artificial shoulder joint: Secondary | ICD-10-CM

## 2024-08-08 ENCOUNTER — Telehealth (HOSPITAL_COMMUNITY): Payer: Self-pay | Admitting: Nurse Practitioner

## 2024-08-08 NOTE — Telephone Encounter (Signed)
 Patient called and cancelled echocardiogram and does not wish to reschedule. Order will be removed from the active echo WQ.

## 2024-08-13 ENCOUNTER — Encounter (HOSPITAL_COMMUNITY): Payer: Self-pay

## 2024-08-13 ENCOUNTER — Encounter (HOSPITAL_COMMUNITY): Payer: Self-pay | Admitting: Orthopedic Surgery

## 2024-08-13 NOTE — Progress Notes (Signed)
 Surgical Instructions   Your procedure is scheduled on Monday, 08/20/24. Report to [facility] Main Entrance A at 1:15 PM, then check in with the Admitting office. Any questions or running late day of surgery: call [phone]  Questions prior to your surgery date: call [phone], Monday-Friday, 8am-4pm. If you experience any cold or flu symptoms such as cough, fever, chills, shortness of breath, etc. between now and your scheduled surgery, please notify us  at the above number.     Remember:  Do not eat after midnight the night before your surgery  You may drink clear liquids until 12:15 PM, the day of your surgery.   Clear liquids allowed are: Water, Non-Citrus Juices (without pulp), Carbonated Beverages, Clear Tea (no milk, honey, etc.), Black Coffee Only (NO MILK, CREAM OR POWDERED CREAMER of any kind), and Gatorade.    Take these medicines the morning of surgery with A SIP OF WATER: buPROPion  (WELLBUTRIN  XL)  estradiol (ESTRACE)    May take these medicines IF NEEDED: albuterol (VENTOLIN HFA)  Oxycodone  HCl    One week prior to surgery, STOP taking any Aspirin (unless otherwise instructed by your surgeon) Aleve, Naproxen, Ibuprofen, Motrin, Advil, Goody's, BC's, all herbal medications, fish oil, and non-prescription vitamins.                     Do NOT Smoke (Tobacco/Vaping) for 24 hours prior to your procedure.  If you use a CPAP at night, you may bring your mask/headgear for your overnight stay.   You will be asked to remove any contacts, glasses, piercing's, hearing aid's, dentures/partials prior to surgery. Please bring cases for these items if needed.    Your surgeon will determine if you are to be admitted or discharged the same day.  Patients discharged the day of surgery will not be allowed to drive home, and someone needs to stay with them for 24 hours.  SURGICAL WAITING ROOM VISITATION Patients may have no more than 2 support people in the waiting area - these  visitors may rotate.   Pre-op nurse will coordinate an appropriate time for 2 ADULT support persons, who may not rotate, to accompany patient in pre-op.  Children under the age of 82 must have an adult with them who is not the patient and must remain in the main waiting area with an adult.  If the patient needs to stay at the hospital during part of their recovery, the visitor guidelines for inpatient rooms apply.  Please refer to the [facility] website for the visitor guidelines for any additional information.   If you received a COVID test during your pre-op visit  it is requested that you wear a mask when out in public, stay away from anyone that may not be feeling well and notify your surgeon if you develop symptoms. If you have been in contact with anyone that has tested positive in the last 10 days please notify you surgeon.    Oral Hygiene is also important to reduce your risk of infection.  Remember - BRUSH YOUR TEETH THE MORNING OF SURGERY WITH YOUR REGULAR TOOTHPASTE   [place]- Preparing for Total Shoulder Arthroplasty  Before surgery, you can play an important role. Because skin is not sterile, your skin needs to be as free of germs as possible. You can reduce the number of germs on your skin by using the following products.   Benzoyl Peroxide Gel  o Reduces the number of germs present on the skin  o  Applied twice a day to shoulder area starting two days before surgery   Chlorhexidine  Gluconate (CHG) Soap (instructions listed above on how to wash with CHG Soap)  o An antiseptic cleaner that kills germs and bonds with the skin to continue killing germs even after washing  o Used for showering the night before surgery and morning of surgery   ==================================================================  Please follow these instructions carefully:  BENZOYL PEROXIDE 5% GEL  Please do not use if you have an allergy to benzoyl peroxide. If your skin becomes  reddened/irritated stop using the benzoyl peroxide.  Starting two days before surgery, apply as follows:  1. Apply benzoyl peroxide in the morning and at night. Apply after taking a shower. If you are not taking a shower clean entire shoulder front, back, and side along with the armpit with a clean wet washcloth.  2. Place a quarter-sized dollop on your SHOULDER and rub in thoroughly, making sure to cover the front, back, and side of your shoulder, along with the armpit.   2 Days prior to Surgery First Dose on _____________ Morning Second Dose on ______________ Night  Day Before Surgery First Dose on ______________ Morning Night before surgery wash (entire body except face and private areas) with CHG Soap THEN Second Dose on ____________ Night      4. Do NOT apply benzoyl peroxide gel on the day of surgery   [place]- Preparing For Surgery  Before surgery, you can play an important role. Because skin is not sterile, your skin needs to be as free of germs as possible. You can reduce the number of germs on your skin by washing with CHG (chlorahexidine gluconate) Soap before surgery.  CHG is an antiseptic cleaner which kills germs and bonds with the skin to continue killing germs even after washing.     Please do not use if you have an allergy to CHG or antibacterial soaps. If your skin becomes reddened/irritated stop using the CHG.  Do not shave (including legs and underarms) for at least 48 hours prior to first CHG shower. It is OK to shave your face.  Please follow these instructions carefully.     Shower the NIGHT BEFORE SURGERY  with CHG Soap.   If you chose to wash your hair, wash your hair first as usual with your normal shampoo. After you shampoo, rinse your hair and body thoroughly to remove the shampoo.  Then [employer] and genitals (private parts) with your normal soap and rinse thoroughly to remove soap.  After that Use CHG Soap as you would any other liquid soap. You  can apply CHG directly to the skin and wash gently with a scrungie or a clean washcloth.   Apply the CHG Soap to your body ONLY FROM THE NECK DOWN.  Do not use on open wounds or open sores. Avoid contact with your eyes, ears, mouth and genitals (private parts). Wash Face and genitals (private parts)  with your normal soap.   Wash thoroughly, paying special attention to the area where your surgery will be performed.  Thoroughly rinse your body with warm water from the neck down.  DO NOT shower/wash with your normal soap after using and rinsing off the CHG Soap.  Pat yourself dry with a CLEAN TOWEL.  8. Apply the Benzoyl Peroxide only the night before surgery.  Do Not use it the morning of surgery.  Wear CLEAN PAJAMAS to bed the night before surgery  Place CLEAN SHEETS on your bed the  night before your surgery  DO NOT SLEEP WITH PETS.   Day of Surgery: Wear Clean/Comfortable clothing the morning of surgery Do not apply any deodorants/lotions.   Remember to brush your teeth WITH YOUR REGULAR TOOTHPASTE.   Please read over the following fact sheets that you were given.          Pre-operative 4 CHG Bathing Instructions   You can play a key role in reducing the risk of infection after surgery. Your skin needs to be as free of germs as possible. You can reduce the number of germs on your skin by washing with CHG (chlorhexidine  gluconate) soap before surgery. CHG is an antiseptic soap that kills germs and continues to kill germs even after washing.   DO NOT use if you have an allergy to chlorhexidine /CHG or antibacterial soaps. If your skin becomes reddened or irritated, stop using the CHG and notify one of our RNs at [phone].   Please shower with the CHG soap starting 4 days before surgery using the following schedule:     Please keep in mind the following:  DO NOT shave, including legs and underarms, starting the day of your first shower.   You may shave your face at any  point before/day of surgery.  Place clean sheets on your bed the day you start using CHG soap. Use a clean washcloth (not used since being washed) for each shower. DO NOT sleep with pets once you start using the CHG.   CHG Shower Instructions:  Wash your face and private area with normal soap. If you choose to wash your hair, wash first with your normal shampoo.  After you use shampoo/soap, rinse your hair and body thoroughly to remove shampoo/soap residue.  Turn the water OFF and apply  bottle of CHG soap to a CLEAN washcloth.  Apply CHG soap ONLY FROM YOUR NECK DOWN TO YOUR TOES (washing for 3-5 minutes)  DO NOT use CHG soap on face, private areas, open wounds, or sores.  Pay special attention to the area where your surgery is being performed.  If you are having back surgery, having someone wash your back for you may be helpful. Wait 2 minutes after CHG soap is applied, then you may rinse off the CHG soap.  Pat dry with a clean towel  Put on clean clothes/pajamas   If you choose to wear lotion, please use ONLY the CHG-compatible lotions that are listed below.  Additional instructions for the day of surgery:  If you choose, you may shower the morning of surgery with an antibacterial soap.  DO NOT APPLY any lotions, deodorants, cologne, or perfumes.   Do not bring valuables to the hospital. [facility] is not responsible for any belongings/valuables. Do not wear nail polish, gel polish, artificial nails, or any other type of covering on natural nails (fingers and toes) Do not wear jewelry or makeup Put on clean/comfortable clothes.  Please brush your teeth.  Ask your nurse before applying any prescription medications to the skin.     CHG Compatible Lotions   Aveeno Moisturizing lotion  Cetaphil Moisturizing Cream  Cetaphil Moisturizing Lotion  Clairol Herbal Essence Moisturizing Lotion, Dry Skin  Clairol Herbal Essence Moisturizing Lotion, Extra Dry Skin  Clairol Herbal  Essence Moisturizing Lotion, Normal Skin  Curel Age Defying Therapeutic Moisturizing Lotion with Alpha Hydroxy  Curel Extreme Care Body Lotion  Curel Soothing Hands Moisturizing Hand Lotion  Curel Therapeutic Moisturizing Cream, Fragrance-Free  Curel Therapeutic Moisturizing Lotion, Fragrance-Free  Curel  Therapeutic Moisturizing Lotion, Original Formula  Eucerin Daily Replenishing Lotion  Eucerin Dry Skin Therapy Plus Alpha Hydroxy Crme  Eucerin Dry Skin Therapy Plus Alpha Hydroxy Lotion  Eucerin Original Crme  Eucerin Original Lotion  Eucerin Plus Crme Eucerin Plus Lotion  Eucerin TriLipid Replenishing Lotion  Keri Anti-Bacterial Hand Lotion  Keri Deep Conditioning Original Lotion Dry Skin Formula Softly Scented  Keri Deep Conditioning Original Lotion, Fragrance Free Sensitive Skin Formula  Keri Lotion Fast Absorbing Fragrance Free Sensitive Skin Formula  Keri Lotion Fast Absorbing Softly Scented Dry Skin Formula  Keri Original Lotion  Keri Skin Renewal Lotion Keri Silky Smooth Lotion  Keri Silky Smooth Sensitive Skin Lotion  Nivea Body Creamy Conditioning Oil  Nivea Body Extra Enriched Lotion  Nivea Body Original Lotion  Nivea Body Sheer Moisturizing Lotion Nivea Crme  Nivea Skin Firming Lotion  NutraDerm 30 Skin Lotion  NutraDerm Skin Lotion  NutraDerm Therapeutic Skin Cream  NutraDerm Therapeutic Skin Lotion  ProShield Protective Hand Cream  Provon moisturizing lotion  Please read over the following fact sheets that you were given.

## 2024-08-14 ENCOUNTER — Encounter (HOSPITAL_COMMUNITY): Payer: Self-pay

## 2024-08-14 ENCOUNTER — Inpatient Hospital Stay (HOSPITAL_COMMUNITY): Admission: RE | Admit: 2024-08-14 | Discharge: 2024-08-14 | Attending: Orthopedic Surgery

## 2024-08-14 ENCOUNTER — Other Ambulatory Visit: Payer: Self-pay

## 2024-08-14 VITALS — BP 145/88 | HR 85 | Temp 98.2°F | Resp 17 | Ht 67.0 in | Wt 124.5 lb

## 2024-08-14 DIAGNOSIS — I471 Supraventricular tachycardia, unspecified: Secondary | ICD-10-CM | POA: Diagnosis not present

## 2024-08-14 DIAGNOSIS — M47812 Spondylosis without myelopathy or radiculopathy, cervical region: Secondary | ICD-10-CM | POA: Diagnosis not present

## 2024-08-14 DIAGNOSIS — I10 Essential (primary) hypertension: Secondary | ICD-10-CM | POA: Insufficient documentation

## 2024-08-14 DIAGNOSIS — Z01812 Encounter for preprocedural laboratory examination: Secondary | ICD-10-CM | POA: Insufficient documentation

## 2024-08-14 DIAGNOSIS — M19012 Primary osteoarthritis, left shoulder: Secondary | ICD-10-CM | POA: Diagnosis not present

## 2024-08-14 DIAGNOSIS — Z96612 Presence of left artificial shoulder joint: Secondary | ICD-10-CM | POA: Diagnosis not present

## 2024-08-14 DIAGNOSIS — Z87891 Personal history of nicotine dependence: Secondary | ICD-10-CM | POA: Insufficient documentation

## 2024-08-14 DIAGNOSIS — K7460 Unspecified cirrhosis of liver: Secondary | ICD-10-CM | POA: Insufficient documentation

## 2024-08-14 DIAGNOSIS — K769 Liver disease, unspecified: Secondary | ICD-10-CM

## 2024-08-14 DIAGNOSIS — M50323 Other cervical disc degeneration at C6-C7 level: Secondary | ICD-10-CM | POA: Insufficient documentation

## 2024-08-14 DIAGNOSIS — F419 Anxiety disorder, unspecified: Secondary | ICD-10-CM | POA: Diagnosis not present

## 2024-08-14 DIAGNOSIS — Z01818 Encounter for other preprocedural examination: Secondary | ICD-10-CM

## 2024-08-14 HISTORY — DX: Essential (primary) hypertension: I10

## 2024-08-14 LAB — TYPE AND SCREEN
ABO/RH(D): A POS
Antibody Screen: NEGATIVE

## 2024-08-14 LAB — SURGICAL PCR SCREEN
MRSA, PCR: NEGATIVE
Staphylococcus aureus: NEGATIVE

## 2024-08-14 NOTE — Progress Notes (Addendum)
 PCP - [facility] Cardiologist - Daneen Perkins, NP  PPM/ICD - denies Device Orders - n/a Rep Notified - n/a  Chest x-ray - denies EKG - 07/16/2024 Stress Test - denies ECHO -05/03/2019  Cardiac Cath - denies  Sleep Study - denies CPAP - n/a  Fasting Blood Sugar - no DM Checks Blood Sugar _____ times a day  Last dose of GLP1 agonist-  n/a GLP1 instructions: n/a  Blood Thinner Instructions: n/a Aspirin Instructions: n/a  ERAS Protcol -yes PRE-SURGERY Ensure or G2- Ensure  COVID TEST- n/a   Anesthesia review: yes, SVT, palpitations, mitral valve regurgitation, HTN. Pt saw a cardiologist on 07/16/2024; per pt she had a heart monitoring for 2 weeks, just sent it back. Last time pt had palpitations a couple days ago; no chest pain per pt. [place] is aware. Pt refuses CBC and CMP since she had them drawn on 07/16/2024. [place] is aware.  Patient denies shortness of breath, fever, cough and chest pain at PAT appointment   All instructions explained to the patient, with a verbal understanding of the material. Patient agrees to go over the instructions while at home for a better understanding. Patient also instructed to self quarantine after being tested for COVID-19. The opportunity to ask questions was provided.

## 2024-08-14 NOTE — Pre-Procedure Instructions (Signed)
 Surgical Instructions     Your procedure is scheduled on Monday, January 19th.  Report to [facility] Main Entrance A at 1:15 PM, then check in with the Admitting office. Any questions or running late day of surgery: call [phone]   Questions prior to your surgery date: call [phone], Monday-Friday, 8am-4pm. If you experience any cold or flu symptoms such as cough, fever, chills, shortness of breath, etc. between now and your scheduled surgery, please notify us  at the above number.            Remember:       Do not eat after midnight the night before your surgery   You may drink clear liquids until 12:15 PM, the day of your surgery.   Clear liquids allowed are: Water, Non-Citrus Juices (without pulp), Carbonated Beverages, Clear Tea (no milk, honey, etc.), Black Coffee Only (NO MILK, CREAM OR POWDERED CREAMER of any kind), and Gatorade.  Patient Instructions  The night before surgery:  No food after midnight. ONLY clear liquids after midnight  The day of surgery (if you do NOT have diabetes):  Drink ONE (1) Pre-Surgery Clear Ensure by 1215 pm the day of surgery. Drink in one sitting. Do not sip.  This drink was given to you during your hospital  pre-op appointment visit.  Nothing else to drink after completing the  Pre-Surgery Clear Ensure.           If you have questions, please contact your surgeons office.           Take these medicines the morning of surgery with A SIP OF WATER: buPROPion  (WELLBUTRIN  XL)  estradiol (ESTRACE)      May take these medicines IF NEEDED: albuterol (VENTOLIN HFA)  Oxycodone  HCl      One week prior to surgery, STOP taking any Aspirin (unless otherwise instructed by your surgeon) Aleve, Naproxen, Ibuprofen, Motrin, Advil, Goody's, BC's, all herbal medications, fish oil, and non-prescription vitamins.                     Do NOT Smoke (Tobacco/Vaping) for 24 hours prior to your procedure.   If you use a CPAP at night, you may  bring your mask/headgear for your overnight stay.   You will be asked to remove any contacts, glasses, piercing's, hearing aid's, dentures/partials prior to surgery. Please bring cases for these items if needed.    Your surgeon will determine if you are to be admitted or discharged the same day.  Patients discharged the day of surgery will not be allowed to drive home, and someone needs to stay with them for 24 hours.   SURGICAL WAITING ROOM VISITATION Patients may have no more than 2 support people in the waiting area - these visitors may rotate.   Pre-op nurse will coordinate an appropriate time for 2 ADULT support persons, who may not rotate, to accompany patient in pre-op.  Children under the age of 30 must have an adult with them who is not the patient and must remain in the main waiting area with an adult.   If the patient needs to stay at the hospital during part of their recovery, the visitor guidelines for inpatient rooms apply.   Please refer to the [facility] website for the visitor guidelines for any additional information.     If you received a COVID test during your pre-op visit  it is requested that you wear a mask when out in public, stay away from anyone  that may not be feeling well and notify your surgeon if you develop symptoms. If you have been in contact with anyone that has tested positive in the last 10 days please notify you surgeon.      Oral Hygiene is also important to reduce your risk of infection.  Remember - BRUSH YOUR TEETH THE MORNING OF SURGERY WITH YOUR REGULAR TOOTHPASTE     [place]- Preparing for Total Shoulder Arthroplasty   Before surgery, you can play an important role. Because skin is not sterile, your skin needs to be as free of germs as possible. You can reduce the number of germs on your skin by using the following products.    Benzoyl Peroxide Gel   o Reduces the number of germs present on the skin   o Applied twice a day to shoulder  area starting two days before surgery    Chlorhexidine  Gluconate (CHG) Soap (instructions listed above on how to wash with CHG Soap)   o An antiseptic cleaner that kills germs and bonds with the skin to continue killing germs even after washing   o Used for showering the night before surgery and morning of surgery     ==================================================================   Please follow these instructions carefully:   BENZOYL PEROXIDE 5% GEL   Please do not use if you have an allergy to benzoyl peroxide. If your skin becomes reddened/irritated stop using the benzoyl peroxide.   Starting two days before surgery, apply as follows:   1. Apply benzoyl peroxide in the morning and at night. Apply after taking a shower. If you are not taking a shower clean entire shoulder front, back, and side along with the armpit with a clean wet washcloth.   2. Place a quarter-sized dollop on your SHOULDER and rub in thoroughly, making sure to cover the front, back, and side of your shoulder, along with the armpit.         2 Days prior to Surgery First Dose on _____________ Morning Second Dose on ______________ Night   Day Before Surgery First Dose on ______________ Morning Night before surgery wash (entire body except face and private areas) with CHG Soap THEN Second Dose on ____________ Night          4. Do NOT apply benzoyl peroxide gel on the day of surgery     [place]- Preparing For Surgery   Before surgery, you can play an important role. Because skin is not sterile, your skin needs to be as free of germs as possible. You can reduce the number of germs on your skin by washing with CHG (chlorahexidine gluconate) Soap before surgery.  CHG is an antiseptic cleaner which kills germs and bonds with the skin to continue killing germs even after washing.       Please do not use if you have an allergy to CHG or antibacterial soaps. If your skin becomes reddened/irritated stop  using the CHG.  Do not shave (including legs and underarms) for at least 48 hours prior to first CHG shower. It is OK to shave your face.   Please follow these instructions carefully.           Shower the NIGHT BEFORE SURGERY  with CHG Soap.        If you chose to wash your hair, wash your hair first as usual with your normal shampoo. After you shampoo, rinse your hair and body thoroughly to remove the shampoo.  Then [employer] and genitals (private parts)  with your normal soap and rinse thoroughly to remove soap.   After that Use CHG Soap as you would any other liquid soap. You can apply CHG directly to the skin and wash gently with a scrungie or a clean washcloth.    Apply the CHG Soap to your body ONLY FROM THE NECK DOWN.  Do not use on open wounds or open sores. Avoid contact with your eyes, ears, mouth and genitals (private parts). Wash Face and genitals (private parts)  with your normal soap.    Wash thoroughly, paying special attention to the area where your surgery will be performed.   Thoroughly rinse your body with warm water from the neck down.   DO NOT shower/wash with your normal soap after using and rinsing off the CHG Soap.   Pat yourself dry with a CLEAN TOWEL.   8. Apply the Benzoyl Peroxide only the night before surgery.  Do Not use it the morning of surgery.   Wear CLEAN PAJAMAS to bed the night before surgery   Place CLEAN SHEETS on your bed the night before your surgery   DO NOT SLEEP WITH PETS.     Day of Surgery: Wear Clean/Comfortable clothing the morning of surgery Do not apply any deodorants/lotions.   Remember to brush your teeth WITH YOUR REGULAR TOOTHPASTE.   Please read over the following fact sheets that you were given.                   Pre-operative 4 CHG Bathing Instructions    You can play a key role in reducing the risk of infection after surgery. Your skin needs to be as free of germs as possible. You can reduce the number of germs on  your skin by washing with CHG (chlorhexidine  gluconate) soap before surgery. CHG is an antiseptic soap that kills germs and continues to kill germs even after washing.    DO NOT use if you have an allergy to chlorhexidine /CHG or antibacterial soaps. If your skin becomes reddened or irritated, stop using the CHG and notify one of our RNs at [phone].    Please shower with the CHG soap starting 4 days before surgery using the following schedule:       Please keep in mind the following:  DO NOT shave, including legs and underarms, starting the day of your first shower.   You may shave your face at any point before/day of surgery.  Place clean sheets on your bed the day you start using CHG soap. Use a clean washcloth (not used since being washed) for each shower. DO NOT sleep with pets once you start using the CHG.    CHG Shower Instructions:  Wash your face and private area with normal soap. If you choose to wash your hair, wash first with your normal shampoo.  After you use shampoo/soap, rinse your hair and body thoroughly to remove shampoo/soap residue.  Turn the water OFF and apply  bottle of CHG soap to a CLEAN washcloth.  Apply CHG soap ONLY FROM YOUR NECK DOWN TO YOUR TOES (washing for 3-5 minutes)  DO NOT use CHG soap on face, private areas, open wounds, or sores.  Pay special attention to the area where your surgery is being performed.  If you are having back surgery, having someone wash your back for you may be helpful. Wait 2 minutes after CHG soap is applied, then you may rinse off the CHG soap.  Pat dry with a clean towel  Put on clean clothes/pajamas   If you choose to wear lotion, please use ONLY the CHG-compatible lotions that are listed below.   Additional instructions for the day of surgery:   If you choose, you may shower the morning of surgery with an antibacterial soap.  DO NOT APPLY any lotions, deodorants, cologne, or perfumes.   Do not bring valuables to the  hospital. [facility] is not responsible for any belongings/valuables. Do not wear nail polish, gel polish, artificial nails, or any other type of covering on natural nails (fingers and toes) Do not wear jewelry or makeup Put on clean/comfortable clothes.  Please brush your teeth.  Ask your nurse before applying any prescription medications to the skin.        CHG Compatible Lotions    Aveeno Moisturizing lotion  Cetaphil Moisturizing Cream  Cetaphil Moisturizing Lotion  Clairol Herbal Essence Moisturizing Lotion, Dry Skin  Clairol Herbal Essence Moisturizing Lotion, Extra Dry Skin  Clairol Herbal Essence Moisturizing Lotion, Normal Skin  Curel Age Defying Therapeutic Moisturizing Lotion with Alpha Hydroxy  Curel Extreme Care Body Lotion  Curel Soothing Hands Moisturizing Hand Lotion  Curel Therapeutic Moisturizing Cream, Fragrance-Free  Curel Therapeutic Moisturizing Lotion, Fragrance-Free  Curel Therapeutic Moisturizing Lotion, Original Formula  Eucerin Daily Replenishing Lotion  Eucerin Dry Skin Therapy Plus Alpha Hydroxy Crme  Eucerin Dry Skin Therapy Plus Alpha Hydroxy Lotion  Eucerin Original Crme  Eucerin Original Lotion  Eucerin Plus Crme Eucerin Plus Lotion  Eucerin TriLipid Replenishing Lotion  Keri Anti-Bacterial Hand Lotion  Keri Deep Conditioning Original Lotion Dry Skin Formula Softly Scented  Keri Deep Conditioning Original Lotion, Fragrance Free Sensitive Skin Formula  Keri Lotion Fast Absorbing Fragrance Free Sensitive Skin Formula  Keri Lotion Fast Absorbing Softly Scented Dry Skin Formula  Keri Original Lotion  Keri Skin Renewal Lotion Keri Silky Smooth Lotion  Keri Silky Smooth Sensitive Skin Lotion  Nivea Body Creamy Conditioning Oil  Nivea Body Extra Enriched Lotion  Nivea Body Original Lotion  Nivea Body Sheer Moisturizing Lotion Nivea Crme  Nivea Skin Firming Lotion  NutraDerm 30 Skin Lotion  NutraDerm Skin Lotion  NutraDerm Therapeutic Skin  Cream  NutraDerm Therapeutic Skin Lotion  ProShield Protective Hand Cream  Provon moisturizing lotion   Please read over the following fact sheets that you were given.

## 2024-08-15 NOTE — Progress Notes (Signed)
 Anesthesia Chart Review:  Case: [id number] Date/Time: 08/20/24 1500   Procedures:      REVISION, TOTAL ARTHROPLASTY, SHOULDER (Left: Shoulder) - Left shoulder arthroplasty revision all components vs left shoulder explantation with implantation of antibiotic spacer hemiarthroplasty     REMOVAL, HARDWARE, SHOULDER, WITH IRRIGATION, DEBRIDEMENT, AND INSERTION OF ANTIBIOTIC BEADS OR ANTIBIOTIC SPACER (Left: Shoulder)   Anesthesia type: Choice   Pre-op diagnosis: Left shoulder reverse implant loosening   [facility] ROOM 02 / [facility] OR   Surgeons: Sharl Selinda Dover, MD       DISCUSSION: Is a 74 year old female scheduled for the above procedure.  History includes former smoker (quit 08/20/2009), HTN, skin cancer, anxiety, hepatitis C (s/p sofosbuvir & ribavirin 06/21/2013-12/14/2013), cirrhosis, rotator cuff arthropathy (s/p left reverse TSA 09/19/2020), osteoarthritis (left TKA 07/04/2023).  Cardiology evaluation by Daneen Perkins, NP on 07/15/2024: Preoperative cardiac evaluation: Patient states she is pending knee surgery.  She is able to complete greater than 4 METS at baseline.  Doing monitor, echo as above, however, testing should not preclude surgery. Based on ACC/AHA guidelines, patient would be at acceptable risk for the planned procedure without further cardiovascular testing.   Long term monitor 07/2024 showed HR 44-164 with average HR 80 bpm, predominant SR, 8 SVT runs longest up to 10 beats, rare SVE and VEs. TTE in 05/2019 showed LVEF 60-65%, normal LV size, mildly increased LV septal wall and LV posterior wall thickness, no LVH, impaired diastolic relaxation pattern of LV diastolic filling, normal RV systolic function, normal MR structure with mild MR, mild TR, mild aortic valve sclerosis without stenosis.    Per [facility] note on 08/29/2023, 2V cervical AP and lateral, done in our office today and reviewed by me. Reviewed with the patient. Cervical DDD, greatest at C6-7. Cervical  spondylosis is also present and severe. Somewhat forward flexion of her cervical spine.   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 PLT count had been as low as 53K dating back to 2011, but improved following treatment of HCV. Per available labs in [facility] PLT count was 154K 03/18/2020, 118K 09/08/2020, 124K on 07/16/2024. Her last PT/INR noted is from 05/03/2016 and was normal at 11.7/1.0.  I called and spoke with her. She is not currently followed by GI or a Liver specialist, but her PCP at [facility] does routine labs including periodic HCV viral loads and PT/INR. Her last labs there were on 03/20/2024. Her viral loads have been negative. She reported her last PT/INR was 11.7/1.1 on 06/06/2023. She denied prior paracentesis. She is not aware of any varices. No known GI bleeding. She says she received spinal anesthesia for left TKA in 07/2023 without issue.  Since she had labs on 07/16/2024 through [facility], she declined a repeat CBC and CMP at her 08/14/2024 PAT visit. Results from 07/16/2024 included: WBC 5.7, H/H 14.9/43.9, PLT 124K. CMP was essentially normal. She did get a T&S. As above, PT/INR reported 11.7/1.1 on 06/06/2023 (patient has copies of her PCP labs on her cell phone for review as needed on the day of surgery.)  Given stable labs that will be ~ 80 days old, she prefers no additional preoperative labs unless anesthesiologist or surgeon feels absolutely necessary. I think this is reasonable, but she is aware that ultimately would be up to her day of surgery team. She has had multiple normal PT/INR since treatment of HCV and only mild thrombocytopenia over the past several years without  known bleeding. She prefers regional block as part of her anesthesia plan and hopes to be discharged home from PACU. She indicated Dr. Sharl was hopeful she would be same day surgery, but it will also depend on her PACU course.   Anesthesia team to evaluate on the  day of surgery.    VS: BP (!) 145/88   Pulse 85   Temp 36.8 C   Resp 17   Ht 5' 7 (1.702 m)   Wt 56.5 kg   SpO2 98%   BMI 19.50 kg/m    PROVIDERS: PCP Toribio Bible, PA-C, MSPAS at [facility] in [place], [place] (a driect primary care practice) - She was followed by [facility] until ~ 05/2016. Per 05/03/2016 visit with Nola Lister, MD, Ms Marinez is clinically stable from a liver perspective. Her calculated MELD-Na score is 7, which offers no priority for liver transplantation. Today's MRI does not show concern for hepatocellular carcinoma (HCC). There is persistence of dilation of her biliary tree, but this has been seen previously and investigated via EUS, which occurred last year and was unrevealing. EGD/EUS results from 10/18/2014 not viewing in Care Everywhere. She hd mild hypertensive gastrophaty and no varices by EGD on 11/18/2010.   LABS: She declined a repeat CBC and CMP since they were just done on 07/16/2024. WBC 5.7, H/H 14.9/43.9, PLT 124K. CMP was essentially normal. She did get a T&S. (all labs ordered are listed, but only abnormal results are displayed)  Labs Reviewed  SURGICAL PCR SCREEN  TYPE AND SCREEN   She reported her last PT/INR was done by PCP on 114/2024 and was 11.7/1.1.   IMAGES: CT Left Shoulder 07/23/2024: IMPRESSION: 1. Status post left reverse total shoulder arthroplasty. Superior rotation of the glenosphere prosthetic component with near-complete disengagement with the humeral prosthesis. Significant glenoid bone loss and periprosthetic lucency, compatible with loosening. Two fractured screw fragments noted within the inferior and mid glenoid. 2. The superior aspect of the rotated glenosphere component nearly abuts the undersurface of the acromion which demonstrates subcortical irregularity and cystic changes. 3. Decreased osseous mineralization surrounding the proximal humeral stem. 4. No acute fracture  identified. 5. Glenohumeral joint effusion. 6. Mild degenerative changes of the acromioclavicular joint. 7. Severe atrophy of the supraspinatus and subscapularis muscles. Moderate atrophy of the infraspinatus muscle. 8. Suspected punctate metallic fragments adjacent to the proximal humerus.   MRI Abd/MRCP 05/03/2016 ([facility]): Impression:  1. Morphology of cirrhosis with evidence of portal hypertension. No  arterial enhancing lesions identified.  2.  Stable intrahepatic and extrahepatic biliary dilation.    EKG: 07/16/2024: Normal sinus rhythm   CV: Long term patch monitor 07/19/2024 - 08/01/2024: Patient had a min HR of 44 bpm, max HR of 164 bpm, and avg HR of 80 bpm. Predominant underlying rhythm was Sinus Rhythm. 8 Supraventricular Tachycardia runs occurred, the run with the fastest interval lasting 10 beats with a max rate of 164 bpm, the  longest lasting 9 beats with an avg rate of 122 bpm. Isolated SVEs were rare (<1.0%), SVE Couplets were rare (<1.0%), and SVE Triplets were rare (<1.0%). Isolated VEs were rare (<1.0%), VE Couplets were rare (<1.0%), and no VE Triplets were present.    Echo 05/03/2019: IMPRESSIONS   1. Left ventricular ejection fraction, by visual estimation, is 60 to  65%. The left ventricle has normal function. Normal left ventricular size.  Left ventricular septal wall thickness was mildly increased. Mildly  increased left ventricular posterior  wall thickness.  There is no left ventricular hypertrophy.   2. Left ventricular diastolic Doppler parameters are consistent with  impaired relaxation pattern of LV diastolic filling.   3. Global right ventricle has normal systolic function.The right  ventricular size is normal. No increase in right ventricular wall  thickness.   4. Left atrial size was normal.   5. Right atrial size was normal.   6. The mitral valve is normal in structure. Mild mitral valve  regurgitation. No evidence of mitral stenosis.   7.  The tricuspid valve is normal in structure. Tricuspid valve  regurgitation is mild.   8. The aortic valve is tricuspid Aortic valve regurgitation was not  visualized by color flow Doppler. Mild aortic valve sclerosis without  stenosis.   9. The pulmonic valve was normal in structure. Pulmonic valve  regurgitation is not visualized by color flow Doppler.  10. Normal pulmonary artery systolic pressure.  11. The inferior vena cava is normal in size with greater than 50%  respiratory variability, suggesting right atrial pressure of 3 mmHg.    Past Medical History:  Diagnosis Date   Anxiety    Arthritis    Cancer (HCC)    skin Ca face,arms,back   Chronic shoulder pain    h/o 3 surgeries on right shoulder. h/o 1 surgery on left shoulder   Cirrhosis [facility])    sees Dr Nola @ [facility]. Gets MRI Q 6 mos   Depression    Hepatitis    sees Dr Nola @ [facility]-has MRI Q 6 months   Hypertension     Past Surgical History:  Procedure Laterality Date   ABDOMINAL HYSTERECTOMY  05/18/2010   still has ovaries   APPENDECTOMY     CHOLECYSTECTOMY     EYE SURGERY     catarac   KNEE ARTHROPLASTY Left 2025   REVERSE SHOULDER ARTHROPLASTY Left 09/19/2020   Procedure: LEFT  REVERSE SHOULDER ARTHROPLASTY;  Surgeon: Sharl Selinda Dover, MD;  Location: [facility]S;  Service: Orthopedics;  Laterality: Left;  2.5 hrs   shoulder sugery     3 times    MEDICATIONS:  albuterol (VENTOLIN HFA) 108 (90 Base) MCG/ACT inhaler   ALPRAZolam (XANAX) 1 MG tablet   Ascorbic Acid (VITAMIN C) 1000 MG tablet   buPROPion  (WELLBUTRIN  XL) 150 MG 24 hr tablet   coconut oil OIL   CREATINE PO   estradiol (ESTRACE) 0.5 MG tablet   GLUTATHIONE PO   MAGNESIUM PO   methocarbamol  (ROBAXIN ) 500 MG tablet   MILK THISTLE PO   Multiple Vitamin (MULTIVITAMIN PO)   OVER THE COUNTER MEDICATION   OVER THE COUNTER MEDICATION   OVER THE COUNTER MEDICATION   Oxycodone  HCl 10 MG TABS   Vitamin D-Vitamin K (VITAMIN K2-VITAMIN D3 PO)   No  current facility-administered medications for this encounter.    Isaiah Ruder, PA-C Surgical Short Stay/Anesthesiology [facility] Phone [phone] [facility] Phone [phone] 08/16/2024 12:41 PM

## 2024-08-16 DIAGNOSIS — R002 Palpitations: Secondary | ICD-10-CM

## 2024-08-16 NOTE — Anesthesia Preprocedure Evaluation (Addendum)
 "                                  Anesthesia Evaluation  Patient identified by MRN, date of birth, ID band Patient awake    Reviewed: Allergy & Precautions, NPO status , Patient's Chart, lab work & pertinent test results  History of Anesthesia Complications Negative for: history of anesthetic complications  Airway Mallampati: I  TM Distance: >3 FB Neck ROM: Full    Dental  (+) Dental Advisory Given   Pulmonary COPD,  COPD inhaler, former smoker   breath sounds clear to auscultation       Cardiovascular hypertension, (-) angina  Rhythm:Regular Rate:Normal  '20 ECHO: EF 60 to 65%.  1.The LV has normal function. Normal left ventricular size. Left ventricular septal wall thickness was mildly increased. Mildly increased left ventricular posterior wall thickness. There is no left ventricular hypertrophy.   2. Left ventricular diastolic Doppler parameters are consistent with  impaired relaxation pattern of LV diastolic filling.   3. Global right ventricle has normal systolic function.The right ventricular size is normal. No increase in right ventricular wall thickness.   4. Left atrial size was normal.   5. Right atrial size was normal.   6. The mitral valve is normal in structure. Mild mitral valve regurgitation. No evidence of mitral stenosis.   7. The tricuspid valve is normal in structure. Tricuspid valve regurgitation is mild.   8. The aortic valve is tricuspid Aortic valve regurgitation was not  visualized by color flow Doppler. Mild aortic valve sclerosis without stenosis.     Neuro/Psych   Anxiety Depression    negative neurological ROS     GI/Hepatic negative GI ROS,,,(+) Cirrhosis       , Hepatitis -  Endo/Other  negative endocrine ROS    Renal/GU negative Renal ROS     Musculoskeletal   Abdominal   Peds  Hematology   Anesthesia Other Findings   Reproductive/Obstetrics                              Anesthesia  Physical Anesthesia Plan  ASA: 2  Anesthesia Plan: General   Post-op Pain Management: Regional block* and Tylenol  PO (pre-op)*   Induction: Intravenous  PONV Risk Score and Plan: 3 and Ondansetron , Dexamethasone  and Droperidol  Airway Management Planned: Oral ETT  Additional Equipment: None  Intra-op Plan:   Post-operative Plan: Extubation in OR  Informed Consent: I have reviewed the patients History and Physical, chart, labs and discussed the procedure including the risks, benefits and alternatives for the proposed anesthesia with the patient or authorized representative who has indicated his/her understanding and acceptance.     Dental advisory given  Plan Discussed with: CRNA and Surgeon  Anesthesia Plan Comments: (PAT note written 08/16/2024 by Melissa Zelenak, PA-C. She prefers nerve block as part of her anesthesia plan and hopes for same day discharge.  Since she had stable labs on 07/16/2024 through [facility], she declined a repeat CBC and CMP at her 08/14/2024 PAT visit. Results from 07/16/2024 included: WBC 5.7, H/H 14.9/43.9, PLT 124K. CMP was essentially normal. She did get a T&S. PT/INR reported as 11.7/1.1 from 06/06/2023 (patient has copies of her PCP labs on her cell phone for review as needed on the day of surgery.)   )         Anesthesia Quick Evaluation  "

## 2024-08-20 ENCOUNTER — Encounter (HOSPITAL_COMMUNITY): Payer: Self-pay | Admitting: Orthopedic Surgery

## 2024-08-20 ENCOUNTER — Observation Stay (HOSPITAL_COMMUNITY)
Admission: RE | Admit: 2024-08-20 | Discharge: 2024-08-21 | Disposition: A | Discharge status: Home / Self Care | Attending: Orthopedic Surgery | Admitting: Orthopedic Surgery

## 2024-08-20 ENCOUNTER — Encounter (HOSPITAL_COMMUNITY): Admission: RE | Disposition: A | Payer: Self-pay | Source: Home / Self Care | Attending: Orthopedic Surgery

## 2024-08-20 ENCOUNTER — Inpatient Hospital Stay (HOSPITAL_COMMUNITY): Payer: Self-pay | Admitting: Vascular Surgery

## 2024-08-20 ENCOUNTER — Inpatient Hospital Stay (HOSPITAL_BASED_OUTPATIENT_CLINIC_OR_DEPARTMENT_OTHER): Payer: Self-pay | Admitting: Anesthesiology

## 2024-08-20 ENCOUNTER — Other Ambulatory Visit: Payer: Self-pay

## 2024-08-20 DIAGNOSIS — Z87891 Personal history of nicotine dependence: Secondary | ICD-10-CM | POA: Insufficient documentation

## 2024-08-20 DIAGNOSIS — I10 Essential (primary) hypertension: Secondary | ICD-10-CM | POA: Insufficient documentation

## 2024-08-20 DIAGNOSIS — T84038A Mechanical loosening of other internal prosthetic joint, initial encounter: Principal | ICD-10-CM | POA: Insufficient documentation

## 2024-08-20 DIAGNOSIS — X58XXXA Exposure to other specified factors, initial encounter: Secondary | ICD-10-CM | POA: Diagnosis not present

## 2024-08-20 DIAGNOSIS — Z79899 Other long term (current) drug therapy: Secondary | ICD-10-CM | POA: Insufficient documentation

## 2024-08-20 DIAGNOSIS — Z96612 Presence of left artificial shoulder joint: Secondary | ICD-10-CM | POA: Insufficient documentation

## 2024-08-20 DIAGNOSIS — J449 Chronic obstructive pulmonary disease, unspecified: Secondary | ICD-10-CM | POA: Insufficient documentation

## 2024-08-20 DIAGNOSIS — Z01818 Encounter for other preprocedural examination: Principal | ICD-10-CM

## 2024-08-20 DIAGNOSIS — M25512 Pain in left shoulder: Secondary | ICD-10-CM | POA: Diagnosis present

## 2024-08-20 DIAGNOSIS — Z85828 Personal history of other malignant neoplasm of skin: Secondary | ICD-10-CM | POA: Insufficient documentation

## 2024-08-20 HISTORY — PX: TOTAL SHOULDER REVISION: SHX6130

## 2024-08-20 HISTORY — DX: Type 2 diabetes mellitus without complications: E11.9

## 2024-08-20 LAB — COMPREHENSIVE METABOLIC PANEL WITH GFR
ALT: 20 U/L (ref 0–44)
AST: 26 U/L (ref 15–41)
Albumin: 4.4 g/dL (ref 3.5–5.0)
Alkaline Phosphatase: 60 U/L (ref 38–126)
Anion gap: 14 (ref 5–15)
BUN: 13 mg/dL (ref 8–23)
CO2: 22 mmol/L (ref 22–32)
Calcium: 9.2 mg/dL (ref 8.9–10.3)
Chloride: 102 mmol/L (ref 98–111)
Creatinine, Ser: 0.64 mg/dL (ref 0.44–1.00)
GFR, Estimated: >60 mL/min
Glucose, Bld: 93 mg/dL (ref 70–99)
Potassium: 4.3 mmol/L (ref 3.5–5.1)
Sodium: 138 mmol/L (ref 135–145)
Total Bilirubin: 0.8 mg/dL (ref 0.0–1.2)
Total Protein: 7.2 g/dL (ref 6.5–8.1)

## 2024-08-20 LAB — CBC
HCT: 41.5 % (ref 36.0–46.0)
Hemoglobin: 14.6 g/dL (ref 12.0–15.0)
MCH: 32.1 pg (ref 26.0–34.0)
MCHC: 35.2 g/dL (ref 30.0–36.0)
MCV: 91.2 fL (ref 80.0–100.0)
Platelets: 109 K/uL — ABNORMAL LOW (ref 150–400)
RBC: 4.55 MIL/uL (ref 3.87–5.11)
RDW: 12.8 % (ref 11.5–15.5)
WBC: 4.5 K/uL (ref 4.0–10.5)
nRBC: 0 % (ref 0.0–0.2)

## 2024-08-20 MED ORDER — ACETAMINOPHEN 325 MG PO TABS: 325.0000 mg | ORAL_TABLET | Freq: Four times a day (QID) | ORAL | Status: DC | PRN

## 2024-08-20 MED ORDER — ONDANSETRON HCL 4 MG/2ML IJ SOLN: 4.0000 mg | Freq: Four times a day (QID) | INTRAMUSCULAR | Status: DC | PRN

## 2024-08-20 MED ORDER — SUGAMMADEX SODIUM 200 MG/2ML IV SOLN
INTRAVENOUS | Status: DC | PRN
  Administered 2024-08-20: 200 mg via INTRAVENOUS

## 2024-08-20 MED ORDER — OXYCODONE HCL 5 MG PO TABS: 5.0000 mg | ORAL_TABLET | Freq: Once | ORAL | Status: DC | PRN

## 2024-08-20 MED ORDER — ONDANSETRON HCL 4 MG/2ML IJ SOLN
INTRAMUSCULAR | Status: DC | PRN
  Administered 2024-08-20: 4 mg via INTRAVENOUS

## 2024-08-20 MED ORDER — LACTATED RINGERS IV SOLN: INTRAVENOUS | Status: DC

## 2024-08-20 MED ORDER — MIDAZOLAM HCL (PF) 2 MG/2ML IJ SOLN: 1.0000 mg | Freq: Once | INTRAMUSCULAR | Status: AC

## 2024-08-20 MED ORDER — DEXAMETHASONE SOD PHOSPHATE PF 10 MG/ML IJ SOLN
INTRAMUSCULAR | Status: DC | PRN
  Administered 2024-08-20: 5 mg via INTRAVENOUS

## 2024-08-20 MED ORDER — DOCUSATE SODIUM 100 MG PO CAPS
100.0000 mg | ORAL_CAPSULE | Freq: Two times a day (BID) | ORAL | Status: DC
  Administered 2024-08-20 – 2024-08-21 (×2): 100 mg via ORAL
  Filled 2024-08-20 (×2): qty 1

## 2024-08-20 MED ORDER — PHENYLEPHRINE HCL-NACL 20-0.9 MG/250ML-% IV SOLN
INTRAVENOUS | Status: DC | PRN
  Administered 2024-08-20: 25 ug/min via INTRAVENOUS

## 2024-08-20 MED ORDER — BUPIVACAINE LIPOSOME 1.3 % IJ SUSP
INTRAMUSCULAR | Status: DC | PRN
  Administered 2024-08-20: 10 mL via PERINEURAL

## 2024-08-20 MED ORDER — ORAL CARE MOUTH RINSE: 15.0000 mL | Freq: Once | OROMUCOSAL | Status: AC

## 2024-08-20 MED ORDER — FENTANYL CITRATE (PF) 100 MCG/2ML IJ SOLN: 50.0000 ug | Freq: Once | INTRAMUSCULAR | Status: AC

## 2024-08-20 MED ORDER — ESTRADIOL 0.5 MG PO TABS
0.5000 mg | ORAL_TABLET | Freq: Every day | ORAL | Status: DC
  Administered 2024-08-21: 0.5 mg via ORAL
  Filled 2024-08-20 (×2): qty 1

## 2024-08-20 MED ORDER — HYDROMORPHONE HCL 1 MG/ML IJ SOLN
0.5000 mg | INTRAMUSCULAR | Status: DC | PRN
  Administered 2024-08-20 – 2024-08-21 (×3): 1 mg via INTRAVENOUS
  Filled 2024-08-20 (×3): qty 1

## 2024-08-20 MED ORDER — EPHEDRINE SULFATE-NACL 50-0.9 MG/10ML-% IV SOSY
PREFILLED_SYRINGE | INTRAVENOUS | Status: DC | PRN
  Administered 2024-08-20: 5 mg via INTRAVENOUS
  Administered 2024-08-20: 10 mg via INTRAVENOUS

## 2024-08-20 MED ORDER — METOCLOPRAMIDE HCL 5 MG/ML IJ SOLN: 5.0000 mg | Freq: Three times a day (TID) | INTRAMUSCULAR | Status: DC | PRN

## 2024-08-20 MED ORDER — ACETAMINOPHEN 500 MG PO TABS
1000.0000 mg | ORAL_TABLET | Freq: Four times a day (QID) | ORAL | Status: DC
  Filled 2024-08-20 (×2): qty 2

## 2024-08-20 MED ORDER — ROCURONIUM BROMIDE 10 MG/ML (PF) SYRINGE
PREFILLED_SYRINGE | INTRAVENOUS | Status: AC
  Filled 2024-08-20: qty 10

## 2024-08-20 MED ORDER — TRANEXAMIC ACID-NACL 1000-0.7 MG/100ML-% IV SOLN
1000.0000 mg | Freq: Once | INTRAVENOUS | Status: AC
  Administered 2024-08-20: 1000 mg via INTRAVENOUS
  Filled 2024-08-20: qty 100

## 2024-08-20 MED ORDER — DEXAMETHASONE SOD PHOSPHATE PF 10 MG/ML IJ SOLN
INTRAMUSCULAR | Status: AC
  Filled 2024-08-20: qty 1

## 2024-08-20 MED ORDER — 0.9 % SODIUM CHLORIDE (POUR BTL) OPTIME
TOPICAL | Status: DC | PRN
  Administered 2024-08-20 (×2): 1000 mL

## 2024-08-20 MED ORDER — PHENOL 1.4 % MT LIQD: 1.0000 | OROMUCOSAL | Status: DC | PRN

## 2024-08-20 MED ORDER — ONDANSETRON 4 MG PO TBDP: 4.0000 mg | ORAL_TABLET | Freq: Three times a day (TID) | ORAL | 0 refills | Status: AC | PRN

## 2024-08-20 MED ORDER — CHLORHEXIDINE GLUCONATE 0.12 % MT SOLN
15.0000 mL | Freq: Once | OROMUCOSAL | Status: AC
  Administered 2024-08-20: 15 mL via OROMUCOSAL
  Filled 2024-08-20: qty 15

## 2024-08-20 MED ORDER — ROCURONIUM BROMIDE 10 MG/ML (PF) SYRINGE
PREFILLED_SYRINGE | INTRAVENOUS | Status: DC | PRN
  Administered 2024-08-20: 60 mg via INTRAVENOUS

## 2024-08-20 MED ORDER — MIDAZOLAM HCL (PF) 2 MG/2ML IJ SOLN: 0.5000 mg | Freq: Once | INTRAMUSCULAR | Status: DC | PRN

## 2024-08-20 MED ORDER — ENOXAPARIN SODIUM 40 MG/0.4ML IJ SOSY
40.0000 mg | PREFILLED_SYRINGE | INTRAMUSCULAR | Status: DC
  Filled 2024-08-20: qty 0.4

## 2024-08-20 MED ORDER — HYDROMORPHONE HCL 2 MG PO TABS: 2.0000 mg | ORAL_TABLET | ORAL | 0 refills | Status: AC | PRN

## 2024-08-20 MED ORDER — METOCLOPRAMIDE HCL 5 MG PO TABS: 5.0000 mg | ORAL_TABLET | Freq: Three times a day (TID) | ORAL | Status: DC | PRN

## 2024-08-20 MED ORDER — LIDOCAINE 2% (20 MG/ML) 5 ML SYRINGE
INTRAMUSCULAR | Status: AC
  Filled 2024-08-20: qty 5

## 2024-08-20 MED ORDER — LIDOCAINE 2% (20 MG/ML) 5 ML SYRINGE
INTRAMUSCULAR | Status: DC | PRN
  Administered 2024-08-20: 60 mg via INTRAVENOUS

## 2024-08-20 MED ORDER — VANCOMYCIN HCL 1000 MG IV SOLR
INTRAVENOUS | Status: AC
  Filled 2024-08-20: qty 20

## 2024-08-20 MED ORDER — MENTHOL 3 MG MT LOZG: 1.0000 | LOZENGE | OROMUCOSAL | Status: DC | PRN

## 2024-08-20 MED ORDER — CEFAZOLIN SODIUM-DEXTROSE 2-4 GM/100ML-% IV SOLN
2.0000 g | Freq: Four times a day (QID) | INTRAVENOUS | Status: AC
  Administered 2024-08-20 – 2024-08-21 (×2): 2 g via INTRAVENOUS
  Filled 2024-08-20 (×2): qty 100

## 2024-08-20 MED ORDER — HYDROMORPHONE HCL 1 MG/ML IJ SOLN
INTRAMUSCULAR | Status: AC
  Filled 2024-08-20: qty 1

## 2024-08-20 MED ORDER — ALPRAZOLAM 0.5 MG PO TABS
0.5000 mg | ORAL_TABLET | Freq: Every evening | ORAL | Status: DC | PRN
  Administered 2024-08-20: 0.5 mg via ORAL
  Filled 2024-08-20: qty 1

## 2024-08-20 MED ORDER — OXYCODONE HCL 5 MG/5ML PO SOLN: 5.0000 mg | Freq: Once | ORAL | Status: DC | PRN

## 2024-08-20 MED ORDER — ACETAMINOPHEN 500 MG PO TABS
1000.0000 mg | ORAL_TABLET | Freq: Once | ORAL | Status: DC
  Filled 2024-08-20: qty 2

## 2024-08-20 MED ORDER — MEPERIDINE HCL 25 MG/ML IJ SOLN: 6.2500 mg | INTRAMUSCULAR | Status: DC | PRN

## 2024-08-20 MED ORDER — OXYCODONE HCL 5 MG PO TABS
5.0000 mg | ORAL_TABLET | ORAL | Status: DC | PRN
  Administered 2024-08-20 – 2024-08-21 (×2): 10 mg via ORAL
  Filled 2024-08-20 (×3): qty 2

## 2024-08-20 MED ORDER — PROPOFOL 10 MG/ML IV BOLUS
INTRAVENOUS | Status: AC
  Filled 2024-08-20: qty 20

## 2024-08-20 MED ORDER — TRANEXAMIC ACID-NACL 1000-0.7 MG/100ML-% IV SOLN
1000.0000 mg | INTRAVENOUS | Status: AC
  Administered 2024-08-20: 1000 mg via INTRAVENOUS
  Filled 2024-08-20: qty 100

## 2024-08-20 MED ORDER — ONDANSETRON HCL 4 MG/2ML IJ SOLN
INTRAMUSCULAR | Status: AC
  Filled 2024-08-20: qty 2

## 2024-08-20 MED ORDER — PROPOFOL 10 MG/ML IV BOLUS
INTRAVENOUS | Status: DC | PRN
  Administered 2024-08-20: 30 mg via INTRAVENOUS
  Administered 2024-08-20: 150 mg via INTRAVENOUS

## 2024-08-20 MED ORDER — ONDANSETRON HCL 4 MG PO TABS: 4.0000 mg | ORAL_TABLET | Freq: Four times a day (QID) | ORAL | Status: DC | PRN

## 2024-08-20 MED ORDER — FENTANYL CITRATE (PF) 100 MCG/2ML IJ SOLN
INTRAMUSCULAR | Status: AC
  Administered 2024-08-20: 50 ug via INTRAVENOUS
  Filled 2024-08-20: qty 2

## 2024-08-20 MED ORDER — PHENYLEPHRINE 80 MCG/ML (10ML) SYRINGE FOR IV PUSH (FOR BLOOD PRESSURE SUPPORT)
PREFILLED_SYRINGE | INTRAVENOUS | Status: DC | PRN
  Administered 2024-08-20: 80 ug via INTRAVENOUS
  Administered 2024-08-20: 160 ug via INTRAVENOUS

## 2024-08-20 MED ORDER — BUPIVACAINE-EPINEPHRINE (PF) 0.5% -1:200000 IJ SOLN
INTRAMUSCULAR | Status: DC | PRN
  Administered 2024-08-20: 10 mL via PERINEURAL

## 2024-08-20 MED ORDER — MIDAZOLAM HCL 2 MG/2ML IJ SOLN
INTRAMUSCULAR | Status: AC
  Administered 2024-08-20: 1 mg via INTRAVENOUS
  Filled 2024-08-20: qty 2

## 2024-08-20 MED ORDER — VANCOMYCIN HCL 1 G IV SOLR
INTRAVENOUS | Status: DC | PRN
  Administered 2024-08-20: 1000 mg

## 2024-08-20 MED ORDER — OXYCODONE HCL 5 MG PO TABS
10.0000 mg | ORAL_TABLET | ORAL | Status: DC | PRN
  Administered 2024-08-21 (×2): 10 mg via ORAL
  Filled 2024-08-20: qty 2

## 2024-08-20 MED ORDER — VITAMIN K2-VITAMIN D3 90-125 MCG PO CAPS: ORAL_CAPSULE | Freq: Every day | ORAL | Status: DC

## 2024-08-20 MED ORDER — CEFAZOLIN SODIUM-DEXTROSE 2-4 GM/100ML-% IV SOLN
2.0000 g | INTRAVENOUS | Status: AC
  Administered 2024-08-20: 2 g via INTRAVENOUS
  Filled 2024-08-20: qty 100

## 2024-08-20 MED ORDER — HYDROMORPHONE HCL 1 MG/ML IJ SOLN
0.2500 mg | INTRAMUSCULAR | Status: DC | PRN
  Administered 2024-08-20: 0.5 mg via INTRAVENOUS

## 2024-08-20 MED ORDER — BUPROPION HCL ER (XL) 150 MG PO TB24
150.0000 mg | ORAL_TABLET | Freq: Every morning | ORAL | Status: DC
  Administered 2024-08-21: 150 mg via ORAL
  Filled 2024-08-20: qty 1

## 2024-08-20 NOTE — Anesthesia Procedure Notes (Signed)
 Procedure Name: Intubation Date/Time: 08/20/2024 3:17 PM  Performed by: Marva Lonni PARAS, CRNAPre-anesthesia Checklist: Patient identified, Emergency Drugs available, Suction available and Patient being monitored Patient Re-evaluated:Patient Re-evaluated prior to induction Oxygen Delivery Method: Circle System Utilized Preoxygenation: Pre-oxygenation with 100% oxygen Induction Type: IV induction Ventilation: Mask ventilation without difficulty Laryngoscope Size: Mac and 4 Grade View: Grade I Tube type: Oral Tube size: 7.0 mm Number of attempts: 1 Airway Equipment and Method: Stylet Placement Confirmation: ETT inserted through vocal cords under direct vision, positive ETCO2 and breath sounds checked- equal and bilateral Secured at: 21 cm Tube secured with: Tape Dental Injury: Teeth and Oropharynx as per pre-operative assessment

## 2024-08-20 NOTE — Discharge Instructions (Signed)
Orthopedic surgery discharge instructions:  -Maintain postoperative bandages for 3 days.  You may remove these bandages on post op day 3 and begin showering at that time.  Please do not submerge underwater.  -Maintain your arm in sling at all times.  You should only remove for showering and getting dressed.  No lifting with the operative arm.  -For mild to moderate pain use Tylenol and Advil in alternating fashion around-the-clock.  For breakthrough pain use oxycodone as necessary.  -Please apply ice to the right shoulder for 20-30 minutes out of each hour that you are awake.  Do this around-the-clock for the first 3 days from surgery.  -Follow-up in 2 weeks for routine postoperative check.  

## 2024-08-20 NOTE — Transfer of Care (Signed)
 Immediate Anesthesia Transfer of Care Note  Patient: Melissa [name]  Procedure(s) Performed: LEFT SHOULDER ARTHROPLASTY REVISION HEMIARTHROPLASTY (Left: Shoulder)  Patient Location: PACU  Anesthesia Type:General  Level of Consciousness: awake, alert , oriented, and patient cooperative  Airway & Oxygen Therapy: Patient Spontanous Breathing  Post-op Assessment: Report given to RN and Post -op Vital signs reviewed and stable  Post vital signs: Reviewed and stable  Last Vitals:  Vitals Value Taken Time  BP 116/78 08/20/24 17:38  Temp    Pulse 115 08/20/24 17:42  Resp 11 08/20/24 17:42  SpO2 95 % 08/20/24 17:42  Vitals shown include unfiled device data.  Last Pain:  Vitals:   08/20/24 1334  TempSrc:   PainSc: 0-No pain         Complications: No notable events documented.

## 2024-08-20 NOTE — Anesthesia Postprocedure Evaluation (Signed)
"   Anesthesia Post Note  Patient: Melissa [name]  Procedure(s) Performed: LEFT SHOULDER ARTHROPLASTY REVISION HEMIARTHROPLASTY (Left: Shoulder)     Patient location during evaluation: PACU Anesthesia Type: General Level of consciousness: awake and alert, oriented and patient cooperative Pain management: pain level controlled Vital Signs Assessment: post-procedure vital signs reviewed and stable Respiratory status: spontaneous breathing, nonlabored ventilation and respiratory function stable Cardiovascular status: blood pressure returned to baseline and stable Postop Assessment: no apparent nausea or vomiting Anesthetic complications: no   No notable events documented.  Last Vitals:  Vitals:   08/20/24 1815 08/20/24 1825  BP: 119/73 118/71  Pulse: 88 86  Resp: 12 15  Temp:  36.7 C  SpO2: 97% 95%    Last Pain:  Vitals:   08/20/24 1738  TempSrc:   PainSc: 0-No pain                 Julianna Vanwagner,E. Obie Silos      "

## 2024-08-20 NOTE — Anesthesia Procedure Notes (Signed)
 Anesthesia Regional Block: Interscalene brachial plexus block   Pre-Anesthetic Checklist: , timeout performed,  Correct Patient, Correct Site, Correct Laterality,  Correct Procedure, Correct Position, site marked,  Risks and benefits discussed,  Surgical consent,  Pre-op evaluation,  At surgeon's request and post-op pain management  Laterality: Left and Upper  Prep: chloraprep       Needles:  Injection technique: Single-shot  Needle Type: Echogenic Needle     Needle Length: 9cm  Needle Gauge: 21     Additional Needles:   Procedures:,,,, ultrasound used (permanent image in chart),,    Narrative:  Start time: 08/20/2024 2:27 PM End time: 08/20/2024 2:33 PM Injection made incrementally with aspirations every 5 mL.  Performed by: Personally  Anesthesiologist: Leonce Athens, MD  Additional Notes: Pt identified in Holding room.  Monitors applied. Working IV access confirmed. Timeout, Sterile prep L clavicle and neck.  #21ga ECHOgenic Arrow block needle to interscalene brachial plexus with US  guidance.  10cc 0.5% Bupivacaine  1:200k epi, Exparel  injected incrementally after negative test dose.  Patient asymptomatic, VSS, no heme aspirated, tolerated well.   JAYSON Leonce, MD
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
## 2024-08-20 NOTE — Brief Op Note (Signed)
 08/20/2024  2:50 PM  5:31 PM  PATIENT:  Hendricks KATHEE Patterson  74 y.o. female  PRE-OPERATIVE DIAGNOSIS:  Left shoulder reverse implant loosening  POST-OPERATIVE DIAGNOSIS:  Left shoulder reverse implant loosening  PROCEDURE:  Procedures: LEFT SHOULDER ARTHROPLASTY REVISION GLENOID AND HUMERAL COMPONENTS TO A STATIC ANTIBIOTIC HEMIARTHROPLASTY (Left)  SURGEON:  Surgeons and Role:    * Sharl Selinda Dover, MD - Primary  PHYSICIAN ASSISTANT: Dayle Moores, PA- C  ANESTHESIA:   regional and general  EBL:  150 mL   BLOOD ADMINISTERED:none  DRAINS: none   LOCAL MEDICATIONS USED:  NONE  SPECIMEN: Left shoulder joint fluid   DISPOSITION OF SPECIMEN:  microbiology  COUNTS:  YES  TOURNIQUET:  * No tourniquets in log *  DICTATION: .Note written in EPIC  PLAN OF CARE: admit for overnight obs  PATIENT DISPOSITION:  PACU - hemodynamically stable.   Delay start of Pharmacological VTE agent (>24hrs) due to surgical blood loss or risk of bleeding: not applicable
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
## 2024-08-21 ENCOUNTER — Encounter (HOSPITAL_COMMUNITY): Payer: Self-pay | Admitting: Orthopedic Surgery

## 2024-08-21 DIAGNOSIS — T84038A Mechanical loosening of other internal prosthetic joint, initial encounter: Secondary | ICD-10-CM | POA: Diagnosis not present

## 2024-08-21 LAB — CBC
HCT: 34.1 % — ABNORMAL LOW (ref 36.0–46.0)
Hemoglobin: 11.8 g/dL — ABNORMAL LOW (ref 12.0–15.0)
MCH: 31.6 pg (ref 26.0–34.0)
MCHC: 34.6 g/dL (ref 30.0–36.0)
MCV: 91.2 fL (ref 80.0–100.0)
Platelets: 87 K/uL — ABNORMAL LOW (ref 150–400)
RBC: 3.74 MIL/uL — ABNORMAL LOW (ref 3.87–5.11)
RDW: 12.7 % (ref 11.5–15.5)
WBC: 6.1 K/uL (ref 4.0–10.5)
nRBC: 0 % (ref 0.0–0.2)

## 2024-08-21 LAB — CREATININE, SERUM
Creatinine, Ser: 0.7 mg/dL (ref 0.44–1.00)
GFR, Estimated: >60 mL/min

## 2024-08-21 MED ORDER — MULTIVITAMIN PO TABS: 1.0000 | ORAL_TABLET | Freq: Every day | ORAL | Status: AC

## 2024-08-21 MED ORDER — MAGNESIUM 100 MG PO TABS: 1.0000 | ORAL_TABLET | Freq: Every day | ORAL | Status: AC

## 2024-08-21 NOTE — Progress Notes (Signed)
" ° °  Subjective:  Melissa [name] is a 74 y.o. female, 1 Day Post-Op    s/p Procedures: LEFT SHOULDER ARTHROPLASTY REVISION HEMIARTHROPLASTY   Patient reports pain as mild to moderate.  Denies n/t.   Objective:   VITALS:   Vitals:   08/20/24 2035 08/21/24 0018 08/21/24 0412 08/21/24 0838  BP: 130/72 134/81 108/66 110/64  Pulse: (!) 110 (!) 107 83 87  Resp: 19 17 17 18   Temp: 98.1 F (36.7 C) 98.7 F (37.1 C) 98.7 F (37.1 C) 98.5 F (36.9 C)  TempSrc: Oral Oral Oral   SpO2: 98% 95% 94% 99%  Weight:      Height:       LUE: Healing deltopectoral incision  Neurologically intact ABD soft Neurovascular intact Sensation intact distally Intact pulses distally Incision: scant drainage Compartment soft  Dressing change due to small amount of blood,   Calves soft, nontender.   Lab Results  Component Value Date   WBC 6.1 08/21/2024   HGB 11.8 (L) 08/21/2024   HCT 34.1 (L) 08/21/2024   MCV 91.2 08/21/2024   PLT 87 (L) 08/21/2024   BMET    Component Value Date/Time   NA 138 08/20/2024 1310   NA 137 07/16/2024 1225   K 4.3 08/20/2024 1310   CL 102 08/20/2024 1310   CO2 22 08/20/2024 1310   GLUCOSE 93 08/20/2024 1310   BUN 13 08/20/2024 1310   BUN 21 07/16/2024 1225   CREATININE 0.70 08/21/2024 0446   CREATININE 0.50 11/19/2015 1626   CALCIUM 9.2 08/20/2024 1310   EGFR 88 07/16/2024 1225   GFRNONAA >60 08/21/2024 0446   GFRNONAA >89 11/19/2015 1626     @CHLMMEYESTERDAY   Assessment/Plan: 1 Day Post-Op   Principal Problem:   H/O total shoulder replacement, left   Advance diet Up with therapy  Dispo: d/c today.   Weightbearing Status: Nonweightbearing in the sling x2 weeks, ok for elbow, wrist, digit ROM.  DVT Prophylaxis:  can hold on lovenox  today, aspirin 81mg  daily at home  Narda Fundora D Smrithi Pigford 08/21/2024, 11:23 AM  Dayle Moores PA-C  Physician Assistant with Dr. Sharl Gaba [place]  "

## 2024-08-21 NOTE — Plan of Care (Signed)
" °  Problem: Education: Goal: Knowledge of General Education information will improve Description: Including pain rating scale, medication(s)/side effects and non-pharmacologic comfort measures Outcome: Progressing   Problem: Clinical Measurements: Goal: Ability to maintain clinical measurements within normal limits will improve Outcome: Progressing   Problem: Pain Managment: Goal: General experience of comfort will improve and/or be controlled Outcome: Progressing   Problem: Safety: Goal: Ability to remain free from injury will improve Outcome: Progressing   Problem: Education: Goal: Knowledge of the prescribed therapeutic regimen will improve Outcome: Progressing   Problem: Bowel/Gastric: Goal: Gastrointestinal status for postoperative course will improve Outcome: Progressing   Problem: Education: Goal: Knowledge of the prescribed therapeutic regimen will improve Outcome: Progressing   Problem: Activity: Goal: Ability to tolerate increased activity will improve Outcome: Progressing   Problem: Pain Management: Goal: Pain level will decrease with appropriate interventions Outcome: Progressing   "
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
## 2024-08-21 NOTE — Progress Notes (Signed)
 Discharge summary  (AVS) provided to pt with instructions. Pt verbalized understanding of instructions. Pt d/c to home as ordered, she remains alert/oriented in no appparent distress.. Pt's spouse is resposible for her transport.

## 2024-08-21 NOTE — Care Management Obs Status (Signed)
 MEDICARE OBSERVATION STATUS NOTIFICATION   Patient Details  Name: Melissa [name] MRN: [id number] Date of Birth: 10-Jun-1951   Medicare Observation Status Notification Given:  Yes    Jennie Laneta Dragon 08/21/2024, 1:08 PM

## 2024-08-24 ENCOUNTER — Ambulatory Visit (HOSPITAL_COMMUNITY)

## 2024-08-25 LAB — AEROBIC/ANAEROBIC CULTURE W GRAM STAIN (SURGICAL/DEEP WOUND): Culture: NO GROWTH

## 2024-10-10 ENCOUNTER — Ambulatory Visit: Admitting: Cardiology
